# Patient Record
Sex: Male | Born: 1960 | Race: White | Hispanic: No | Smoking: Former smoker
Health system: Southern US, Community
[De-identification: ages and names within clinical notes are randomized; demographics above are authoritative.]

## PROBLEM LIST (undated history)

## (undated) DIAGNOSIS — F329 Major depressive disorder, single episode, unspecified: Secondary | ICD-10-CM

## (undated) DIAGNOSIS — G5600 Carpal tunnel syndrome, unspecified upper limb: Secondary | ICD-10-CM

## (undated) DIAGNOSIS — F419 Anxiety disorder, unspecified: Secondary | ICD-10-CM

## (undated) DIAGNOSIS — K219 Gastro-esophageal reflux disease without esophagitis: Secondary | ICD-10-CM

## (undated) DIAGNOSIS — F32A Depression, unspecified: Secondary | ICD-10-CM

## (undated) HISTORY — PX: MOUTH SURGERY: SHX715

---

## 2015-06-08 ENCOUNTER — Ambulatory Visit (INDEPENDENT_AMBULATORY_CARE_PROVIDER_SITE_OTHER): Payer: BLUE CROSS/BLUE SHIELD | Admitting: Psychiatry

## 2015-06-08 ENCOUNTER — Encounter (INDEPENDENT_AMBULATORY_CARE_PROVIDER_SITE_OTHER): Payer: Self-pay

## 2015-06-08 VITALS — BP 131/78 | HR 95 | Ht 68.55 in | Wt 238.6 lb

## 2015-06-08 DIAGNOSIS — F331 Major depressive disorder, recurrent, moderate: Secondary | ICD-10-CM

## 2015-06-08 MED ORDER — FLUOXETINE HCL 40 MG PO CAPS: 40.0000 mg | ORAL_CAPSULE | Freq: Every day | ORAL | Status: DC
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
## 2015-09-14 ENCOUNTER — Ambulatory Visit (INDEPENDENT_AMBULATORY_CARE_PROVIDER_SITE_OTHER): Payer: BLUE CROSS/BLUE SHIELD | Admitting: Psychiatry

## 2015-09-14 DIAGNOSIS — F3340 Major depressive disorder, recurrent, in remission, unspecified: Secondary | ICD-10-CM | POA: Diagnosis not present

## 2015-09-14 MED ORDER — FLUOXETINE HCL 40 MG PO CAPS: 40.0000 mg | ORAL_CAPSULE | Freq: Every day | ORAL | Status: DC

## 2015-09-14 NOTE — Progress Notes (Signed)
[facility] MD Progress Note  09/14/2015 5:02 PM Rick Barnes  MRN:  [id number] Subjective:  Feeling great Principal Problem: Major depression recurrent residual Diagnosis:  Major depression, recurrent residual Total Time spent with patient:   Past Psychiatric History: Patient is doing very well. He's back to work without any problems. He denies any irritability or anger. He denies any daily depression at this time. He sleeping much better and eating well. He doesn't drink very much alcohol at all. Have 1 or 2 drinks weekend. Once the patient went back to Prozac 40 mg within a week felt much calmer much more in control and was back to feeling normal. The patient has a good social life. He is not involved with any women. He likes his work great deal. Is a positive outlook. His energy is good. He denies the use of drugs. Is very evident this patient shows a great response to Prozac. He shows no evidence of psychosis. Medically he is very stable.  Past Medical History: No past medical history on file. No past surgical history on file. Family History: No family history on file. Family Psychiatric  History:  Social History:  History  Alcohol Use: Not on file     History  Drug Use Not on file    Social History   Social History  . Marital Status: Divorced    Spouse Name: N/A  . Number of Children: N/A  . Years of Education: N/A   Social History Main Topics  . Smoking status: Not on file  . Smokeless tobacco: Not on file  . Alcohol Use: Not on file  . Drug Use: Not on file  . Sexual Activity: Not on file   Other Topics Concern  . Not on file   Social History Narrative  . No narrative on file   Additional Social History:                         Sleep: Good  Appetite:  Good  Current Medications: Current Outpatient Prescriptions  Medication Sig Dispense Refill  . FLUoxetine (PROZAC) 40 MG capsule Take 1 capsule (40 mg total) by mouth daily. 30 capsule 4  . FLUoxetine  (PROZAC) 40 MG capsule Take 1 capsule (40 mg total) by mouth daily. 30 capsule 5   No current facility-administered medications for this visit.    Lab Results: No results found for this or any previous visit (from the past 48 hour(s)).  Blood Alcohol level:  No results found for: [facility]  Physical Findings: AIMS:  , ,  ,  ,    CIWA:    COWS:     Musculoskeletal: Strength & Muscle Tone: within normal limits Gait & Station: normal Patient leans: N/A  Psychiatric Specialty Exam: ROS  There were no vitals taken for this visit.There is no weight on file to calculate BMI.  General Appearance: Casual and Disheveled  Eye Contact::  Good  Speech:  Clear and Coherent  Volume:  Normal  Mood:  Euthymic  Affect:  Appropriate  Thought Process:  Coherent  Orientation:  Full (Time, Place, and Person)  Thought Content:  WDL  Suicidal Thoughts:  No  Homicidal Thoughts:  No  Memory:  NA  Judgement:  Good  Insight:  NA and Good  Psychomotor Activity:  Normal  Concentration:  Good  Recall:  Good  Fund of Knowledge:Good  Language: Good  Akathisia:  No  Handed:  Right  AIMS (if indicated):  Assets:  Desire for Improvement  ADL's:  Intact  Cognition: WNL  Sleep:      Treatment Plan Summary: At this time the patient will continue taking 40 mg of Prozac. Effective Prozac seems to reduce not only his depression but his irritability and anger. This is not unusual in a man of this patient age. For him a good mood is not depressed means is even calm without anxiety or irritability. The patient is sleeping and eating well. He has no vegetative symptoms at all. He is literally back to himself. He she'll return to see me in 4 months for med check. The patient is not suicidal and uses no significant illicit substances.  Lucas Mallow, MD 09/14/2015, 5:02 PM

## 2016-01-11 ENCOUNTER — Ambulatory Visit (HOSPITAL_COMMUNITY): Payer: Self-pay | Admitting: Psychiatry

## 2016-04-04 ENCOUNTER — Encounter (HOSPITAL_COMMUNITY): Payer: Self-pay | Admitting: Psychiatry

## 2016-04-09 ENCOUNTER — Other Ambulatory Visit: Payer: Self-pay | Admitting: Orthopedic Surgery

## 2016-04-09 ENCOUNTER — Other Ambulatory Visit (HOSPITAL_COMMUNITY): Payer: Self-pay

## 2016-04-09 ENCOUNTER — Other Ambulatory Visit (HOSPITAL_COMMUNITY): Payer: Self-pay | Admitting: Psychiatry

## 2016-04-09 ENCOUNTER — Encounter (HOSPITAL_BASED_OUTPATIENT_CLINIC_OR_DEPARTMENT_OTHER): Payer: Self-pay | Admitting: *Deleted

## 2016-04-09 MED ORDER — FLUOXETINE HCL 40 MG PO CAPS: 40.0000 mg | ORAL_CAPSULE | Freq: Every day | ORAL | 0 refills | Status: AC

## 2016-04-10 ENCOUNTER — Encounter (HOSPITAL_COMMUNITY)
Admission: AD | Disposition: A | Discharge status: Home / Self Care | Payer: Self-pay | Source: Ambulatory Visit | Attending: Nephrology

## 2016-04-10 ENCOUNTER — Inpatient Hospital Stay (HOSPITAL_BASED_OUTPATIENT_CLINIC_OR_DEPARTMENT_OTHER)
Admission: AD | Admit: 2016-04-10 | Discharge: 2016-04-13 | DRG: 987 | Disposition: A | Discharge status: Home / Self Care | Payer: BLUE CROSS/BLUE SHIELD | Source: Ambulatory Visit | Attending: Nephrology | Admitting: Nephrology

## 2016-04-10 ENCOUNTER — Observation Stay (HOSPITAL_COMMUNITY): Payer: BLUE CROSS/BLUE SHIELD

## 2016-04-10 ENCOUNTER — Ambulatory Visit (HOSPITAL_BASED_OUTPATIENT_CLINIC_OR_DEPARTMENT_OTHER): Payer: BLUE CROSS/BLUE SHIELD | Admitting: Anesthesiology

## 2016-04-10 ENCOUNTER — Encounter (HOSPITAL_BASED_OUTPATIENT_CLINIC_OR_DEPARTMENT_OTHER): Payer: Self-pay | Admitting: Certified Registered"

## 2016-04-10 ENCOUNTER — Ambulatory Visit (HOSPITAL_COMMUNITY): Payer: BLUE CROSS/BLUE SHIELD

## 2016-04-10 DIAGNOSIS — K219 Gastro-esophageal reflux disease without esophagitis: Secondary | ICD-10-CM | POA: Diagnosis present

## 2016-04-10 DIAGNOSIS — R0902 Hypoxemia: Secondary | ICD-10-CM

## 2016-04-10 DIAGNOSIS — F419 Anxiety disorder, unspecified: Secondary | ICD-10-CM | POA: Diagnosis present

## 2016-04-10 DIAGNOSIS — Z79899 Other long term (current) drug therapy: Secondary | ICD-10-CM

## 2016-04-10 DIAGNOSIS — G5603 Carpal tunnel syndrome, bilateral upper limbs: Secondary | ICD-10-CM | POA: Diagnosis present

## 2016-04-10 DIAGNOSIS — Z87891 Personal history of nicotine dependence: Secondary | ICD-10-CM

## 2016-04-10 DIAGNOSIS — Z880 Allergy status to penicillin: Secondary | ICD-10-CM

## 2016-04-10 DIAGNOSIS — G5602 Carpal tunnel syndrome, left upper limb: Secondary | ICD-10-CM | POA: Diagnosis not present

## 2016-04-10 DIAGNOSIS — A419 Sepsis, unspecified organism: Secondary | ICD-10-CM | POA: Diagnosis present

## 2016-04-10 DIAGNOSIS — R062 Wheezing: Secondary | ICD-10-CM

## 2016-04-10 DIAGNOSIS — F32A Depression, unspecified: Secondary | ICD-10-CM | POA: Diagnosis present

## 2016-04-10 DIAGNOSIS — K210 Gastro-esophageal reflux disease with esophagitis: Secondary | ICD-10-CM

## 2016-04-10 DIAGNOSIS — J9588 Other intraoperative complications of respiratory system, not elsewhere classified: Principal | ICD-10-CM | POA: Diagnosis present

## 2016-04-10 DIAGNOSIS — T17908A Unspecified foreign body in respiratory tract, part unspecified causing other injury, initial encounter: Secondary | ICD-10-CM | POA: Diagnosis present

## 2016-04-10 DIAGNOSIS — J9601 Acute respiratory failure with hypoxia: Secondary | ICD-10-CM | POA: Diagnosis present

## 2016-04-10 DIAGNOSIS — G5600 Carpal tunnel syndrome, unspecified upper limb: Secondary | ICD-10-CM | POA: Diagnosis present

## 2016-04-10 DIAGNOSIS — R0602 Shortness of breath: Secondary | ICD-10-CM | POA: Diagnosis not present

## 2016-04-10 DIAGNOSIS — Z884 Allergy status to anesthetic agent status: Secondary | ICD-10-CM | POA: Diagnosis not present

## 2016-04-10 DIAGNOSIS — J690 Pneumonitis due to inhalation of food and vomit: Secondary | ICD-10-CM

## 2016-04-10 DIAGNOSIS — D66 Hereditary factor VIII deficiency: Secondary | ICD-10-CM | POA: Diagnosis present

## 2016-04-10 DIAGNOSIS — F329 Major depressive disorder, single episode, unspecified: Secondary | ICD-10-CM | POA: Diagnosis present

## 2016-04-10 DIAGNOSIS — Y838 Other surgical procedures as the cause of abnormal reaction of the patient, or of later complication, without mention of misadventure at the time of the procedure: Secondary | ICD-10-CM | POA: Diagnosis present

## 2016-04-10 HISTORY — DX: Depression, unspecified: F32.A

## 2016-04-10 HISTORY — DX: Anxiety disorder, unspecified: F41.9

## 2016-04-10 HISTORY — DX: Major depressive disorder, single episode, unspecified: F32.9

## 2016-04-10 HISTORY — DX: Carpal tunnel syndrome, unspecified upper limb: G56.00

## 2016-04-10 HISTORY — PX: CARPAL TUNNEL RELEASE: SHX101

## 2016-04-10 HISTORY — DX: Gastro-esophageal reflux disease without esophagitis: K21.9

## 2016-04-10 LAB — BASIC METABOLIC PANEL
ANION GAP: 11 (ref 5–15)
BUN: 9 mg/dL (ref 6–20)
CALCIUM: 8.8 mg/dL — AB (ref 8.9–10.3)
CO2: 22 mmol/L (ref 22–32)
CREATININE: 0.92 mg/dL (ref 0.61–1.24)
Chloride: 103 mmol/L (ref 101–111)
Glucose, Bld: 90 mg/dL (ref 65–99)
Potassium: 3.9 mmol/L (ref 3.5–5.1)
SODIUM: 136 mmol/L (ref 135–145)

## 2016-04-10 LAB — CBC
HEMATOCRIT: 44.6 % (ref 39.0–52.0)
Hemoglobin: 15.8 g/dL (ref 13.0–17.0)
MCH: 33.3 pg (ref 26.0–34.0)
MCHC: 35.4 g/dL (ref 30.0–36.0)
MCV: 94.1 fL (ref 78.0–100.0)
PLATELETS: 183 10*3/uL (ref 150–400)
RBC: 4.74 MIL/uL (ref 4.22–5.81)
RDW: 13.3 % (ref 11.5–15.5)
WBC: 16.6 10*3/uL — AB (ref 4.0–10.5)

## 2016-04-10 LAB — LACTIC ACID, PLASMA
LACTIC ACID, VENOUS: 1.9 mmol/L (ref 0.5–1.9)
Lactic Acid, Venous: 3.6 mmol/L (ref 0.5–1.9)

## 2016-04-10 LAB — PROTIME-INR
INR: 1.05
PROTHROMBIN TIME: 13.7 s (ref 11.4–15.2)

## 2016-04-10 LAB — APTT: aPTT: 35 seconds (ref 24–36)

## 2016-04-10 LAB — PROCALCITONIN: Procalcitonin: 0.23 ng/mL

## 2016-04-10 LAB — TSH: TSH: 0.935 u[IU]/mL (ref 0.350–4.500)

## 2016-04-10 SURGERY — CARPAL TUNNEL RELEASE
Anesthesia: Monitor Anesthesia Care | Site: Wrist | Laterality: Left

## 2016-04-10 MED ORDER — VANCOMYCIN HCL IN DEXTROSE 1-5 GM/200ML-% IV SOLN
INTRAVENOUS | Status: AC
  Filled 2016-04-10: qty 200

## 2016-04-10 MED ORDER — MIDAZOLAM HCL 2 MG/2ML IJ SOLN
INTRAMUSCULAR | Status: AC
  Filled 2016-04-10: qty 2

## 2016-04-10 MED ORDER — GABAPENTIN 300 MG PO CAPS
300.0000 mg | ORAL_CAPSULE | Freq: Once | ORAL | Status: AC
  Administered 2016-04-10: 300 mg via ORAL

## 2016-04-10 MED ORDER — SODIUM CHLORIDE 0.9 % IV BOLUS (SEPSIS)
1000.0000 mL | Freq: Once | INTRAVENOUS | Status: AC
  Administered 2016-04-10: 1000 mL via INTRAVENOUS

## 2016-04-10 MED ORDER — FUROSEMIDE 10 MG/ML IJ SOLN
INTRAMUSCULAR | Status: AC
  Filled 2016-04-10: qty 4

## 2016-04-10 MED ORDER — MIDAZOLAM HCL 2 MG/2ML IJ SOLN
1.0000 mg | INTRAMUSCULAR | Status: DC | PRN
  Administered 2016-04-10: 2 mg via INTRAVENOUS

## 2016-04-10 MED ORDER — ALBUTEROL SULFATE HFA 108 (90 BASE) MCG/ACT IN AERS
INHALATION_SPRAY | RESPIRATORY_TRACT | Status: DC | PRN
  Administered 2016-04-10 (×2): 2 via RESPIRATORY_TRACT

## 2016-04-10 MED ORDER — ALBUTEROL SULFATE (2.5 MG/3ML) 0.083% IN NEBU
INHALATION_SOLUTION | RESPIRATORY_TRACT | Status: AC
  Filled 2016-04-10: qty 3

## 2016-04-10 MED ORDER — GABAPENTIN 300 MG PO CAPS
300.0000 mg | ORAL_CAPSULE | Freq: Three times a day (TID) | ORAL | Status: DC
  Administered 2016-04-10 – 2016-04-13 (×8): 300 mg via ORAL
  Filled 2016-04-10 (×8): qty 1

## 2016-04-10 MED ORDER — ONDANSETRON HCL 4 MG/2ML IJ SOLN
INTRAMUSCULAR | Status: DC | PRN
  Administered 2016-04-10: 4 mg via INTRAVENOUS

## 2016-04-10 MED ORDER — PROMETHAZINE HCL 25 MG/ML IJ SOLN: 6.2500 mg | INTRAMUSCULAR | Status: DC | PRN

## 2016-04-10 MED ORDER — VANCOMYCIN HCL IN DEXTROSE 1-5 GM/200ML-% IV SOLN
1000.0000 mg | Freq: Once | INTRAVENOUS | Status: AC
  Administered 2016-04-10: 1000 mg via INTRAVENOUS

## 2016-04-10 MED ORDER — SODIUM CHLORIDE 0.9 % IV BOLUS (SEPSIS): 1000.0000 mL | Freq: Once | INTRAVENOUS | Status: DC

## 2016-04-10 MED ORDER — FENTANYL CITRATE (PF) 100 MCG/2ML IJ SOLN
50.0000 ug | INTRAMUSCULAR | Status: DC | PRN
  Administered 2016-04-10: 12.5 ug via INTRAVENOUS
  Administered 2016-04-10: 50 ug via INTRAVENOUS

## 2016-04-10 MED ORDER — HYDROCODONE-ACETAMINOPHEN 5-325 MG PO TABS: 1.0000 | ORAL_TABLET | Freq: Four times a day (QID) | ORAL | 0 refills | Status: AC | PRN

## 2016-04-10 MED ORDER — GABAPENTIN 300 MG PO CAPS
ORAL_CAPSULE | ORAL | Status: AC
  Filled 2016-04-10: qty 1

## 2016-04-10 MED ORDER — LIDOCAINE 2% (20 MG/ML) 5 ML SYRINGE
INTRAMUSCULAR | Status: AC
  Filled 2016-04-10: qty 5

## 2016-04-10 MED ORDER — LACTATED RINGERS IV SOLN
INTRAVENOUS | Status: DC
  Administered 2016-04-10 (×2): via INTRAVENOUS

## 2016-04-10 MED ORDER — ONDANSETRON HCL 4 MG/2ML IJ SOLN
INTRAMUSCULAR | Status: AC
  Filled 2016-04-10: qty 2

## 2016-04-10 MED ORDER — VANCOMYCIN HCL IN DEXTROSE 1-5 GM/200ML-% IV SOLN
1000.0000 mg | Freq: Two times a day (BID) | INTRAVENOUS | Status: DC
  Administered 2016-04-11 – 2016-04-13 (×5): 1000 mg via INTRAVENOUS
  Filled 2016-04-10 (×6): qty 200

## 2016-04-10 MED ORDER — FAMOTIDINE 20 MG PO TABS
20.0000 mg | ORAL_TABLET | Freq: Every day | ORAL | Status: DC
  Administered 2016-04-10 – 2016-04-12 (×3): 20 mg via ORAL
  Filled 2016-04-10 (×3): qty 1

## 2016-04-10 MED ORDER — SODIUM CHLORIDE 0.9 % IV SOLN
1.0000 g | Freq: Once | INTRAVENOUS | Status: AC
  Administered 2016-04-10: 1 g via INTRAVENOUS
  Filled 2016-04-10: qty 1

## 2016-04-10 MED ORDER — FENTANYL CITRATE (PF) 100 MCG/2ML IJ SOLN
INTRAMUSCULAR | Status: AC
  Filled 2016-04-10: qty 2

## 2016-04-10 MED ORDER — HYDRALAZINE HCL 20 MG/ML IJ SOLN: 10.0000 mg | Freq: Four times a day (QID) | INTRAMUSCULAR | Status: DC | PRN

## 2016-04-10 MED ORDER — FLUOXETINE HCL 20 MG PO CAPS
40.0000 mg | ORAL_CAPSULE | Freq: Every day | ORAL | Status: DC
  Administered 2016-04-11 – 2016-04-13 (×3): 40 mg via ORAL
  Filled 2016-04-10 (×3): qty 2

## 2016-04-10 MED ORDER — LIDOCAINE HCL (PF) 0.5 % IJ SOLN
INTRAMUSCULAR | Status: DC | PRN
Start: 2016-04-10 — End: 2016-04-10
  Administered 2016-04-10: 30 mL via INTRAVENOUS

## 2016-04-10 MED ORDER — SODIUM CHLORIDE 0.9 % IV SOLN
INTRAVENOUS | Status: AC
  Administered 2016-04-10 – 2016-04-11 (×2): via INTRAVENOUS

## 2016-04-10 MED ORDER — SODIUM CHLORIDE 0.9 % IV BOLUS (SEPSIS): 500.0000 mL | Freq: Once | INTRAVENOUS | Status: DC

## 2016-04-10 MED ORDER — VANCOMYCIN HCL IN DEXTROSE 1-5 GM/200ML-% IV SOLN
1000.0000 mg | Freq: Once | INTRAVENOUS | Status: AC
  Administered 2016-04-10: 1000 mg via INTRAVENOUS
  Filled 2016-04-10: qty 200

## 2016-04-10 MED ORDER — PROPOFOL 10 MG/ML IV BOLUS
INTRAVENOUS | Status: DC | PRN
  Administered 2016-04-10 (×2): 10 mg via INTRAVENOUS

## 2016-04-10 MED ORDER — FENTANYL CITRATE (PF) 100 MCG/2ML IJ SOLN: 25.0000 ug | INTRAMUSCULAR | Status: DC | PRN

## 2016-04-10 MED ORDER — CHLORHEXIDINE GLUCONATE 4 % EX LIQD: 60.0000 mL | Freq: Once | CUTANEOUS | Status: DC

## 2016-04-10 MED ORDER — FUROSEMIDE 10 MG/ML IJ SOLN
10.0000 mg | Freq: Once | INTRAMUSCULAR | Status: AC
  Administered 2016-04-10: 10 mg via INTRAVENOUS

## 2016-04-10 MED ORDER — MORPHINE SULFATE (PF) 2 MG/ML IV SOLN: 2.0000 mg | INTRAVENOUS | Status: DC | PRN

## 2016-04-10 MED ORDER — ALBUTEROL SULFATE (2.5 MG/3ML) 0.083% IN NEBU: 2.5000 mg | INHALATION_SOLUTION | RESPIRATORY_TRACT | Status: DC | PRN

## 2016-04-10 MED ORDER — ALBUTEROL SULFATE (2.5 MG/3ML) 0.083% IN NEBU
2.5000 mg | INHALATION_SOLUTION | Freq: Once | RESPIRATORY_TRACT | Status: AC
  Administered 2016-04-10: 2.5 mg via RESPIRATORY_TRACT

## 2016-04-10 MED ORDER — SODIUM CHLORIDE 0.9 % IV SOLN
1.0000 g | Freq: Three times a day (TID) | INTRAVENOUS | Status: DC
  Administered 2016-04-11 – 2016-04-13 (×7): 1 g via INTRAVENOUS
  Filled 2016-04-10 (×9): qty 1

## 2016-04-10 MED ORDER — GLYCOPYRROLATE 0.2 MG/ML IJ SOLN: 0.2000 mg | Freq: Once | INTRAMUSCULAR | Status: DC | PRN

## 2016-04-10 MED ORDER — ONDANSETRON HCL 4 MG/2ML IJ SOLN: 4.0000 mg | Freq: Four times a day (QID) | INTRAMUSCULAR | Status: DC | PRN

## 2016-04-10 MED ORDER — BUPIVACAINE HCL (PF) 0.25 % IJ SOLN
INTRAMUSCULAR | Status: DC | PRN
  Administered 2016-04-10: 8 mL

## 2016-04-10 MED ORDER — ACETAMINOPHEN 325 MG PO TABS: 650.0000 mg | ORAL_TABLET | Freq: Four times a day (QID) | ORAL | Status: DC | PRN

## 2016-04-10 MED ORDER — HEPARIN SODIUM (PORCINE) 5000 UNIT/ML IJ SOLN
5000.0000 [IU] | Freq: Three times a day (TID) | INTRAMUSCULAR | Status: DC
  Filled 2016-04-10 (×2): qty 1

## 2016-04-10 MED ORDER — SCOPOLAMINE 1 MG/3DAYS TD PT72: 1.0000 | MEDICATED_PATCH | Freq: Once | TRANSDERMAL | Status: DC | PRN

## 2016-04-10 MED ORDER — HYDROCODONE-ACETAMINOPHEN 5-325 MG PO TABS
1.0000 | ORAL_TABLET | ORAL | Status: DC | PRN
  Administered 2016-04-11 – 2016-04-12 (×6): 1 via ORAL
  Filled 2016-04-10 (×7): qty 1

## 2016-04-10 SURGICAL SUPPLY — 36 items
BLADE SURG 15 STRL LF DISP TIS (BLADE) ×1 IMPLANT
BLADE SURG 15 STRL SS (BLADE) ×3
BNDG CMPR 9X4 STRL LF SNTH (GAUZE/BANDAGES/DRESSINGS)
BNDG COHESIVE 3X5 TAN STRL LF (GAUZE/BANDAGES/DRESSINGS) ×3 IMPLANT
BNDG ESMARK 4X9 LF (GAUZE/BANDAGES/DRESSINGS) IMPLANT
BNDG GAUZE ELAST 4 BULKY (GAUZE/BANDAGES/DRESSINGS) ×3 IMPLANT
CHLORAPREP W/TINT 26ML (MISCELLANEOUS) ×3 IMPLANT
CORDS BIPOLAR (ELECTRODE) ×3 IMPLANT
COVER BACK TABLE 60X90IN (DRAPES) ×3 IMPLANT
COVER MAYO STAND STRL (DRAPES) ×3 IMPLANT
CUFF TOURNIQUET SINGLE 18IN (TOURNIQUET CUFF) ×3 IMPLANT
DRAPE EXTREMITY T 121X128X90 (DRAPE) ×3 IMPLANT
DRAPE SURG 17X23 STRL (DRAPES) ×3 IMPLANT
DRSG PAD ABDOMINAL 8X10 ST (GAUZE/BANDAGES/DRESSINGS) ×3 IMPLANT
GAUZE SPONGE 4X4 12PLY STRL (GAUZE/BANDAGES/DRESSINGS) ×3 IMPLANT
GAUZE XEROFORM 1X8 LF (GAUZE/BANDAGES/DRESSINGS) ×3 IMPLANT
GLOVE BIOGEL PI IND STRL 7.0 (GLOVE) IMPLANT
GLOVE BIOGEL PI IND STRL 8.5 (GLOVE) ×1 IMPLANT
GLOVE BIOGEL PI INDICATOR 7.0 (GLOVE) ×4
GLOVE BIOGEL PI INDICATOR 8.5 (GLOVE) ×2
GLOVE ECLIPSE 6.5 STRL STRAW (GLOVE) ×2 IMPLANT
GLOVE SURG ORTHO 8.0 STRL STRW (GLOVE) ×3 IMPLANT
GOWN STRL REUS W/ TWL LRG LVL3 (GOWN DISPOSABLE) ×1 IMPLANT
GOWN STRL REUS W/TWL LRG LVL3 (GOWN DISPOSABLE) ×3
GOWN STRL REUS W/TWL XL LVL3 (GOWN DISPOSABLE) ×3 IMPLANT
NDL PRECISIONGLIDE 27X1.5 (NEEDLE) IMPLANT
NEEDLE PRECISIONGLIDE 27X1.5 (NEEDLE) ×3 IMPLANT
NS IRRIG 1000ML POUR BTL (IV SOLUTION) ×3 IMPLANT
PACK BASIN DAY SURGERY FS (CUSTOM PROCEDURE TRAY) ×3 IMPLANT
STOCKINETTE 4X48 STRL (DRAPES) ×3 IMPLANT
SUT ETHILON 4 0 PS 2 18 (SUTURE) ×3 IMPLANT
SUT VICRYL 4-0 PS2 18IN ABS (SUTURE) IMPLANT
SYR BULB 3OZ (MISCELLANEOUS) ×3 IMPLANT
SYR CONTROL 10ML LL (SYRINGE) ×2 IMPLANT
TOWEL OR 17X24 6PK STRL BLUE (TOWEL DISPOSABLE) ×3 IMPLANT
UNDERPAD 30X30 (UNDERPADS AND DIAPERS) ×1 IMPLANT

## 2016-04-10 NOTE — Care Management Note (Signed)
Case Management Note  Patient Details  Name: Rick Barnes MRN: [id number] Date of Birth: [birth year]  Subjective/Objective:  Patient presents with s/p left carpal tunnel release and became hypoxic, ? If aspirated during procedure, he is septic from aspiration pna per MD note.  NCM will cont to follow for dc needs.                 Action/Plan:   Expected Discharge Date:                  Expected Discharge Plan:  Home w Home Health Services  In-House Referral:     Discharge planning Services  CM Consult  Post Acute Care Choice:    Choice offered to:     DME Arranged:    DME Agency:     HH Arranged:    HH Agency:     Status of Service:  In process, will continue to follow  If discussed at Long Length of Stay Meetings, dates discussed:    Additional Comments:  [name], Jinger [place], RN 04/10/2016, 7:29 PM

## 2016-04-10 NOTE — Anesthesia Preprocedure Evaluation (Addendum)
Anesthesia Evaluation  Patient identified by MRN, date of birth, ID band Patient awake    Reviewed: Allergy & Precautions, NPO status , Patient's Chart, lab work & pertinent test results  Airway Mallampati: II  TM Distance: >3 FB Neck ROM: Full    Dental  (+) Dental Advisory Given, Lower Dentures, Upper Dentures   Pulmonary neg pulmonary ROS, former smoker,    Pulmonary exam normal breath sounds clear to auscultation       Cardiovascular Exercise Tolerance: Good negative cardio ROS Normal cardiovascular exam Rhythm:Regular Rate:Normal     Neuro/Psych PSYCHIATRIC DISORDERS Anxiety Depression negative neurological ROS     GI/Hepatic negative GI ROS, Neg liver ROS, GERD  Medicated,  Endo/Other  negative endocrine ROS  Renal/GU negative Renal ROS  negative genitourinary   Musculoskeletal negative musculoskeletal ROS (+)   Abdominal   Peds  Hematology negative hematology ROS (+)   Anesthesia Other Findings Day of surgery medications reviewed with the patient.  Reproductive/Obstetrics                            Anesthesia Physical Anesthesia Plan  ASA: II  Anesthesia Plan: MAC and Bier Block   Post-op Pain Management:    Induction: Intravenous  Airway Management Planned: Nasal Cannula  Additional Equipment:   Intra-op Plan:   Post-operative Plan:   Informed Consent: I have reviewed the patients History and Physical, chart, labs and discussed the procedure including the risks, benefits and alternatives for the proposed anesthesia with the patient or authorized representative who has indicated his/her understanding and acceptance.   Dental advisory given  Plan Discussed with:   Anesthesia Plan Comments: (Risks/benefits of regional block discussed with patient including risk of bleeding, infection, nerve damage, and possibility of failed block.  Also discussed backup plan of general  anesthesia and associated risks.  Patient wishes to proceed.)        Anesthesia Quick Evaluation

## 2016-04-10 NOTE — Op Note (Signed)
NAME:  Rocky LinkWILLIAMSON, Yousaf            ACCOUNT NO.:  [id number]  MEDICAL RECORD NO.:  [id number]  LOCATION:                                 FACILITY:  PHYSICIAN:  Cindee SaltGary Kassie Keng, M.D.            DATE OF BIRTH:  DATE OF PROCEDURE:  04/10/2016 DATE OF DISCHARGE:                              OPERATIVE REPORT   PREOPERATIVE DIAGNOSIS:  Carpal tunnel syndrome, left hand.  POSTOPERATIVE DIAGNOSIS:  Carpal tunnel syndrome, left hand.  OPERATION:  Decompression of left median nerve.  SURGEON:  Cindee SaltGary Sherene Plancarte, MD.  ANESTHESIA:  Forearm IV regional with local infiltration.  ANESTHESIOLOGIST:  Dr. Desmond Lopeurk.  [facility]  HISTORY:  The patient is a 55 year old male with a history of bilateral carpal tunnel syndrome.  He has elected to undergo surgical decompression to the left side.  Pre, peri, and postoperative course have been discussed along with risks and complications.  He is aware that there is no guarantee with the surgery; the possibility of infection, recurrence of injury to arteries, nerves, tendons, incomplete relief of symptoms and dystrophy.  In the preoperative area, the patient was seen, the extremity marked by both the patient and surgeon and antibiotic given.  PROCEDURE IN DETAIL:  The patient was brought to the operating room, where a forearm-based IV regional anesthetic was carried out without difficulty under the direction of Dr. Lyda PeroneKirk.  He was prepped using ChloraPrep in a supine position with the left arm free.  A 3-minute dry time was allowed.  Time-out taken confirming the patient and procedure. A longitudinal incision was made in the left palm, carried down through subcutaneous tissue.  Bleeders were electrocauterized with bipolar. Palmar fascia was split.  Superficial palmar arch identified.  The flexor tendon to the ring and little finger were identified.  Retractors were placed sweeping the median nerve radially and the ulnar nerve ulnarly.   The flexor retinaculum was incised with sharp dissection on the ulnar border.  A right angle and Sewall retractor were placed between skin and forearm fascia.  The nerve was dissected with blunt dissection from the overlying fascia.  A blunt nose scissors were then used to transect the forearm fascia proximally for approximately 3 cm proximal to the wrist crease under direct vision.  The motor branch was noted to enter into the muscle distally on exploration.  Persistent median artery was present.  Air of compression to the nerve was apparent.  No further lesions were identified.  The wound was irrigated with saline.  The skin was closed with interrupted 4-0 nylon sutures.  A local infiltration with 0.25% bupivacaine without epinephrine was given, approximately 8 mL was used.  A sterile compressive dressing with the fingers free was applied. On deflation of the tourniquet, all fingers immediately pinked.  He was taken to the recovery room for observation in satisfactory condition. He will be discharged home [facility] week on [place].          ______________________________ Cindee SaltGary Halley Shepheard, M.D.     GK/MEDQ  D:  04/10/2016  T:  04/10/2016  Job:  [id number]

## 2016-04-10 NOTE — Transfer of Care (Signed)
Immediate Anesthesia Transfer of Care Note  Patient: Rick Barnes  Procedure(s) Performed: Procedure(s): LEFT CARPAL TUNNEL RELEASE (Left)  Patient Location: PACU  Anesthesia Type:Bier block  Level of Consciousness: awake, alert , oriented and patient cooperative  Airway & Oxygen Therapy: Patient Spontanous Breathing and Patient connected to face mask oxygen  Post-op Assessment: Report given to RN and Post -op Vital signs reviewed and stable  Post vital signs: Reviewed and stable  Last Vitals:  Vitals:   04/10/16 1121  BP: (!) 164/98  Pulse: 96  Resp: 18  Temp: 36.7 C    Last Pain:  Vitals:   04/10/16 1121  TempSrc: Oral  PainSc: 2          Complications: No apparent anesthesia complications

## 2016-04-10 NOTE — Brief Op Note (Signed)
04/10/2016  1:12 PM  PATIENT:  Rick Barnes  55 y.o. male  PRE-OPERATIVE DIAGNOSIS:  Left Carpal Tunnel Syndrome  G56.07  POST-OPERATIVE DIAGNOSIS:  Left Carpal Tunnel Syndrome  G56.07  PROCEDURE:  Procedure(s): LEFT CARPAL TUNNEL RELEASE (Left)  SURGEON:  Surgeon(s) and Role:    * Cindee SaltGary Shatha Hooser, MD - Primary  PHYSICIAN ASSISTANT:   ASSISTANTS: none   ANESTHESIA:   local and regional  EBL:  Total I/O In: -  Out: 2 [Blood:2]  BLOOD ADMINISTERED:none  DRAINS: none   LOCAL MEDICATIONS USED:  BUPIVICAINE   SPECIMEN:  No Specimen  DISPOSITION OF SPECIMEN:  N/A  COUNTS:  YES  TOURNIQUET:   Total Tourniquet Time Documented: Forearm (Left) - 23 minutes Total: Forearm (Left) - 23 minutes   DICTATION: .Other Dictation: Dictation Number [id number]  PLAN OF CARE: Discharge to home after PACU  PATIENT DISPOSITION:  PACU - hemodynamically stable.

## 2016-04-10 NOTE — Anesthesia Postprocedure Evaluation (Signed)
Anesthesia Post Note  Patient: Rick Barnes  Procedure(s) Performed: Procedure(s) (LRB): LEFT CARPAL TUNNEL RELEASE (Left)  Patient location during evaluation: PACU Anesthesia Type: MAC and Bier Block Level of consciousness: awake and alert Pain management: pain level controlled Vital Signs Assessment: post-procedure vital signs reviewed and stable Respiratory status: spontaneous breathing and patient connected to face mask oxygen Cardiovascular status: stable and blood pressure returned to baseline Anesthetic complications: yes Anesthetic complication details: respiratory eventComments: Patient with witnessed aspiration of clear fluid during case with desaturation event requiring bag mask ventilation.  Patient received albuterol intraop, arrived to PACU with audible wheezing and intermittently desaturating into 80s despite 8L O2 facemask.  Patient received duoneb and lasix in PACU with improvement in respiratory function.  Unable to wean patient from facemask oxygen.  After discussion with surgeon, [facility] under care of hospitalist team.  Report by MDA to hospitalist prior to transfer to stepdown unit for continuous O2 monitoring.    Last Vitals:  Vitals:   04/10/16 1630 04/10/16 1645  BP: 139/86 (!) 149/92  Pulse: (!) 104 (!) 105  Resp: (!) 24 (!) 24  Temp:      Last Pain:  Vitals:   04/10/16 1645  TempSrc:   PainSc: 7                  Cecile HearingStephen Edward Lam Mccubbins

## 2016-04-10 NOTE — Progress Notes (Signed)
Pharmacy Antibiotic Note  Rick Barnes is a [age] y.o. male admitted on 04/10/2016 with sepsis.  Pharmacy has been consulted for meropenem and vancomycin dosing.  He is post-op from left carpal tunnel release with wheezing.  CXR: 9/12: neg;  He was hypoxic after extubated from surgery, concern for aspiration pneumonitits.  Temp 101.2, HR 122, 8L of oxygen.  Wt 103.5 kg, vanc 1 gm given at 1220, WBC elevated at 16.6, creat 0.92, PCT 0.23, lactate 1.9,   Plan: meropenem 1 gm IV q8h  Vancomycin 1 gm IV x1 at 2030 then vanc 1 gm IV  q12h per obesity nomogram F/u renal fxn, wbc, temp, culture data Vancomycin trough as needed  Height: 5\' 9"  (175.3 cm) Weight: 228 lb 3.2 oz (103.5 kg) IBW/kg (Calculated) : 70.7  Temp (24hrs), Avg:98.1 F (36.7 C), Min:98.1 F (36.7 C), Max:98.2 F (36.8 C)  No results for input(s): WBC, CREATININE, LATICACIDVEN, VANCOTROUGH, VANCOPEAK, VANCORANDOM, GENTTROUGH, GENTPEAK, GENTRANDOM, TOBRATROUGH, TOBRAPEAK, TOBRARND, AMIKACINPEAK, AMIKACINTROU, AMIKACIN in the last 168 hours.  CrCl cannot be calculated (No order found.).    Allergies  Allergen Reactions  . Penicillins Hives    Antimicrobials this admission: 9/12 vanc>> 9/12 meropenem>>  Dose adjustments this admission:   Microbiology results:   Thank you for allowing pharmacy to be a part of this patient's care.  Herby AbrahamMichelle T. Yun Gutierrez, Pharm.D. [phone] 04/10/2016 6:27 PM

## 2016-04-10 NOTE — Op Note (Signed)
Dictation Number [id number]

## 2016-04-10 NOTE — H&P (Signed)
Rick BelfastJames R Barnes is an 55 y.o. male.   Chief Complaint: numbness left hand ZOX:WRUEAHPI:Rick Barnes is a 55 year old right-hand-dominant male comes in with a complaint of pain in both hands left greater than right with numbness and tingling in the thumb index middle fingers primarily. He states this electrical type of lightening type burning pain with a VAS score of 5/10. This is been going on for years. He states pulling and twisting increases pain for him. He has no history of injury to the hand or to the neck. He states he is awakened 7 out of 7 nights. He is placed on gabapentin by Dr. Margo Barnes. He is referred by Mr. Rick RidgesWalsh is a physical therapist. He has no history of diabetes thyroid problems arthritis or gout. His family history is negative for each of these also. He does have a history of hepatitis C. He states his been cured. There is any heavy use increases his pain for him. He is a Administrator, Civil Serviceprofessional table soccer player. He states contrast passing braces which he has been wearing have helped. He has not had nerve conductions done.He has had his nerve conductions done by Dr. Riccardo Barnes. These were done today. These are reviewed with him. He has no history of diabetes thyroid problem or gout. He does have a history of hep C. He states this is been cured.      Past Medical History:  Diagnosis Date  . Anxiety  . Depressed  . Hemophilia (HCC)  . High serum hepcidin   Past Surgical History:  Procedure Laterality Date  . COLON SURGERY       Past Medical History:  Diagnosis Date  . Anxiety   . Carpal tunnel syndrome   . Depression   . GERD (gastroesophageal reflux disease)     Past Surgical History:  Procedure Laterality Date  . MOUTH SURGERY      History reviewed. No pertinent family history. Social History:  reports that he has quit smoking. He has never used smokeless tobacco. He reports that he drinks alcohol. He reports that he does not use drugs.  Allergies:  Allergies  Allergen Reactions   . Penicillins Hives    Medications Prior to Admission  Medication Sig Dispense Refill  . FLUoxetine (PROZAC) 40 MG capsule Take 1 capsule (40 mg total) by mouth daily. 30 capsule 0  . gabapentin (NEURONTIN) 300 MG capsule Take 300 mg by mouth 3 (three) times daily.    . ranitidine (ZANTAC) 150 MG tablet Take 150 mg by mouth 2 (two) times daily.      No results found for this or any previous visit (from the past 48 hour(s)).  No results found.   Pertinent items are noted in HPI.  Blood pressure (!) 164/98, pulse 96, temperature 98.1 F (36.7 C), temperature source Oral, resp. rate 18, height 5\' 9"  (1.753 m), weight 103.5 kg (228 lb 3.2 oz), SpO2 99 %.  General appearance: alert, cooperative and appears stated age Head: Normocephalic, without obvious abnormality Neck: no JVD Resp: clear to auscultation bilaterally Cardio: regular rate and rhythm, S1, S2 normal, no murmur, click, rub or gallop GI: soft, non-tender; bowel sounds normal; no masses,  no organomegaly Extremities: numbness left hand Pulses: 2+ and symmetric Skin: Skin color, texture, turgor normal. No rashes or lesions Neurologic: Grossly normal Incision/Wound: na  Assessment/Plan Assessment:  1. Bilateral carpal tunnel syndrome    Plan: We have reviewed his nerve conductions with him. This reveals bilateral carpal tunnel syndrome. He has some motor delay  of 5.42 on the left and 6.41 on the right. Waveforms are discussed with him also. He is advised of the possibility of surgical intervention or injections. He would like to have this operated on. He states it is been going on too long. Pre-peri-and postoperative course were discussed along with risks and complications. He is aware that there is no guarantee to the surgery the possibility of infection recurrence injury to arteries nerves tendons incomplete release symptoms and dystrophy are all discussed with him. He would like to go ahead and schedule his left side.  He states this is bothering him more than his right at the present time. He would also like to schedule his right side approximately 1 month later. He does have slightly increased blood pressure today. He states that he was stopped by the police on the way here. He is advised however that he may check with a family physician to have this investigated further. Scheduled for left carpal tunnel release and outpatient under regional anesthesia. Questions are encouraged and answered to his satisfaction. Follow up: No Follow-up on file.      Rick Barnes R 04/10/2016, 11:27 AM

## 2016-04-10 NOTE — Discharge Instructions (Signed)

## 2016-04-10 NOTE — H&P (Signed)
TRH H&P   Patient Demographics:    Cranford Blessinger, is a 55 y.o. male  MRN: [id number]   DOB - 1961/06/10  Admit Date - 04/10/2016  Outpatient Primary MD for the patient is Pcp Not In System    Patient coming from: OR  No chief complaint on file.     HPI:    Americo Vallery  is a 55 y.o. male, With past medical history of anxiety, who came to the hospital for elective left arm carpal tunnel release surgery which was done in the OR today by Dr. Merlyn Lot, after the surgery and extubation patient was noted to be hypoxic, there was suspicion that he aspirated during the procedure, he was requiring oxygen of up to 8 L/m, we would then requested to admit him to the hospital for aspiration pneumonitis.  In the stepdown unit when I came to see the patient he was warm, temperature was 101.2, heart rate was 122, he was still profoundly hypoxic requiring 8 L of oxygen, he is currently septic from aspiration pneumonia with acute hypoxic respiratory failure. He does appear nontoxic at this time. Besides cough and shortness of breath and mild left arm postop pain he's symptom-free. He denies any previous medical issues, no COPD, asthma, nonsmoker.    Review of systems:    In addition to the HPI above,   No Fever-chills, No Headache, No changes with Vision or hearing, No problems swallowing food or Liquids, No Chest pain, ++ Cough & Shortness of Breath, No Abdominal pain, No Nausea or Vommitting, Bowel movements are regular, No Blood in stool or Urine, No dysuria, No new skin rashes or bruises, No new joints pains-aches,  No new weakness, tingling, numbness in any extremity, No recent weight gain or loss, No polyuria,  polydypsia or polyphagia, No significant Mental Stressors.  A full 10 point Review of Systems was done, except as stated above, all other Review of Systems were negative.   With Past History of the following :    Past Medical History:  Diagnosis Date  . Anxiety   . Carpal tunnel syndrome   . Depression   . GERD (gastroesophageal reflux disease)       Past Surgical History:  Procedure Laterality Date  . MOUTH SURGERY        Social History:  Social History  Substance Use Topics  . Smoking status: Former Games developer  . Smokeless tobacco: Never Used  . Alcohol use Yes     Comment: quit 15 yr ago      Family History :   No CAD   Home Medications:   Prior to Admission medications   Medication Sig Start Date End Date Taking? Authorizing Provider  FLUoxetine (PROZAC) 40 MG capsule Take 1 capsule (40 mg total) by mouth daily. 04/09/16 04/09/17 Yes Archer Asa, MD  gabapentin (NEURONTIN) 300 MG capsule Take 300 mg by mouth 3 (three) times daily.   Yes Historical Provider, MD  ranitidine (ZANTAC) 150 MG tablet Take 150 mg by mouth 2 (two) times daily.   Yes Historical Provider, MD  HYDROcodone-acetaminophen (NORCO) 5-325 MG tablet Take 1 tablet by mouth every 6 (six) hours as needed for moderate pain. 04/10/16   Cindee Salt, MD     Allergies:     Allergies  Allergen Reactions  . Penicillins Hives     Physical Exam:   Vitals  Blood pressure (!) 149/92, pulse (!) 105, temperature 98.2 F (36.8 C), resp. rate (!) 24, height 5\' 9"  (1.753 m), weight 103.5 kg (228 lb 3.2 oz), SpO2 95 %.   1. General middle aged white male lying in bed in NAD,     2. Normal affect and insight, Not Suicidal or Homicidal, Awake Alert, Oriented X 3.  3. No F.N deficits, ALL C.Nerves Intact, Strength 5/5 all 4 extremities, Sensation intact all 4 extremities, Plantars down going.  4. Ears and Eyes appear Normal, Conjunctivae clear, PERRLA. Moist Oral Mucosa.  5. Supple Neck, No JVD, No  cervical lymphadenopathy appriciated, No Carotid Bruits.  6. Symmetrical Chest wall movement, Good air movement bilaterally, +ve rales  7. RRR, No Gallops, Rubs or Murmurs, No Parasternal Heave.  8. Positive Bowel Sounds, Abdomen Soft, No tenderness, No organomegaly appriciated,No rebound -guarding or rigidity.  9.  No Cyanosis, Normal Skin Turgor, No Skin Rash or Bruise.  10. Good muscle tone,  joints appear normal , no effusions, Normal ROM. L arm in bandage.  11. No Palpable Lymph Nodes in Neck or Axillae      Data Review:    CBC No results for input(s): WBC, HGB, HCT, PLT, MCV, MCH, MCHC, RDW, LYMPHSABS, MONOABS, EOSABS, BASOSABS, BANDABS in the last 168 hours.  Invalid input(s): NEUTRABS, BANDSABD ------------------------------------------------------------------------------------------------------------------  Chemistries  No results for input(s): NA, K, CL, CO2, GLUCOSE, BUN, CREATININE, CALCIUM, MG, AST, ALT, ALKPHOS, BILITOT in the last 168 hours.  Invalid input(s): GFRCGP ------------------------------------------------------------------------------------------------------------------ CrCl cannot be calculated (No order found.). ------------------------------------------------------------------------------------------------------------------ No results for input(s): TSH, T4TOTAL, T3FREE, THYROIDAB in the last 72 hours.  Invalid input(s): FREET3  Coagulation profile No results for input(s): INR, PROTIME in the last 168 hours. ------------------------------------------------------------------------------------------------------------------- No results for input(s): DDIMER in the last 72 hours. -------------------------------------------------------------------------------------------------------------------  Cardiac Enzymes No results for input(s): CKMB, TROPONINI, MYOGLOBIN in the last 168 hours.  Invalid input(s):  CK ------------------------------------------------------------------------------------------------------------------ No results found for: BNP   ---------------------------------------------------------------------------------------------------------------  Urinalysis No results found for: COLORURINE, APPEARANCEUR, LABSPEC, PHURINE, GLUCOSEU, HGBUR, BILIRUBINUR, KETONESUR, PROTEINUR, UROBILINOGEN, NITRITE, LEUKOCYTESUR  ----------------------------------------------------------------------------------------------------------------   Imaging Results:    X-ray Chest Pa Or Ap  Result Date: 04/10/2016 CLINICAL DATA:  Postop from left carpal tunnel release.  Wheezing. EXAM: CHEST 1 VIEW COMPARISON:  None. FINDINGS: Study somewhat limited by the patient's body habitus and relatively low lung volumes as well as the semi-erect AP technique. On this for the above  limitation, the heart, mediastinum and hila are unremarkable. The lungs are clear.  No pleural effusion or pneumothorax. Skeletal structures are grossly unremarkable. IMPRESSION: No active disease. Electronically Signed   By: Amie Portlandavid  Ormond M.D.   On: 04/10/2016 14:58     Assessment & Plan:     1. Sepsis due to Asp PNA - Be admitted to stepdown, he is still hypoxic, now febrile and tachycardic, sepsis protocol will be initiated, blood cultures, CBC, CMP, lactic acid and pro-calcitonin. IV fluid bolus and maintenance, Empiric IV meropenem and vancomycin as he is PCN allergic. Repeat 2 view chest x-ray in the morning.  2. Acute hypoxic respiratory failure due to #1 above. Treatment as above, oxygen, nebulizer treatments added flutter valve and I S, encouraged to sit in chair.  3. Anxiety. Continue home medications.  4. Elective left carpal tunnel release surgery. Pain control, elevate arm, follow with Dr. Merlyn LotKuzma post discharge.   DVT Prophylaxis Heparin   AM Labs Ordered, also please review Full Orders  Family Communication:  Admission, patients condition and plan of care including tests being ordered have been discussed with the patient  who indicate understanding and agree with the plan and Code Status.  Code Status Full   Likely DC to  Home 2-3 days  Condition GUARDED    Consults called: None    Admission status: Inpt    Time spent in minutes : 35   Susa RaringSINGH,Dannisha Eckmann K M.D on 04/10/2016 at 6:15 PM  Between 7am to 7pm - Pager - [phone]. After 7pm go to [email or web address] - password [email or web address]  [facility] - Office  [phone]

## 2016-04-10 NOTE — Progress Notes (Signed)
CRITICAL VALUE ALERT  Critical value received:  Lactic Acid 3.6  Date of notification:  04/10/2016  Time of notification:  2234  Critical value read back:Yes.    Nurse who received alert:  Ricky StabsKelly Leslyn Monda  MD notified (1st page):  K Schorr   Time of first page:  2235  MD notified (2nd page):  Time of second page:  Responding MD:     Time MD responded:

## 2016-04-11 ENCOUNTER — Inpatient Hospital Stay (HOSPITAL_COMMUNITY): Payer: BLUE CROSS/BLUE SHIELD

## 2016-04-11 ENCOUNTER — Encounter (HOSPITAL_BASED_OUTPATIENT_CLINIC_OR_DEPARTMENT_OTHER): Payer: Self-pay | Admitting: Orthopedic Surgery

## 2016-04-11 ENCOUNTER — Other Ambulatory Visit: Payer: Self-pay | Admitting: Orthopedic Surgery

## 2016-04-11 DIAGNOSIS — R0902 Hypoxemia: Secondary | ICD-10-CM

## 2016-04-11 DIAGNOSIS — J690 Pneumonitis due to inhalation of food and vomit: Secondary | ICD-10-CM

## 2016-04-11 LAB — COMPREHENSIVE METABOLIC PANEL
ALBUMIN: 3.2 g/dL — AB (ref 3.5–5.0)
ALT: 25 U/L (ref 17–63)
AST: 23 U/L (ref 15–41)
Alkaline Phosphatase: 36 U/L — ABNORMAL LOW (ref 38–126)
Anion gap: 6 (ref 5–15)
BUN: 11 mg/dL (ref 6–20)
CHLORIDE: 104 mmol/L (ref 101–111)
CO2: 26 mmol/L (ref 22–32)
Calcium: 8.1 mg/dL — ABNORMAL LOW (ref 8.9–10.3)
Creatinine, Ser: 1.04 mg/dL (ref 0.61–1.24)
GFR calc Af Amer: >60 mL/min (ref >60)
GFR calc non Af Amer: >60 mL/min (ref >60)
GLUCOSE: 123 mg/dL — AB (ref 65–99)
POTASSIUM: 3.5 mmol/L (ref 3.5–5.1)
SODIUM: 136 mmol/L (ref 135–145)
Total Bilirubin: 0.9 mg/dL (ref 0.3–1.2)
Total Protein: 5.5 g/dL — ABNORMAL LOW (ref 6.5–8.1)

## 2016-04-11 LAB — CBC
HCT: 38.2 % — ABNORMAL LOW (ref 39.0–52.0)
HEMOGLOBIN: 13 g/dL (ref 13.0–17.0)
MCH: 32.3 pg (ref 26.0–34.0)
MCHC: 34 g/dL (ref 30.0–36.0)
MCV: 95 fL (ref 78.0–100.0)
PLATELETS: 152 10*3/uL (ref 150–400)
RBC: 4.02 MIL/uL — AB (ref 4.22–5.81)
RDW: 13.3 % (ref 11.5–15.5)
WBC: 15.9 10*3/uL — AB (ref 4.0–10.5)

## 2016-04-11 LAB — URINALYSIS, ROUTINE W REFLEX MICROSCOPIC
BILIRUBIN URINE: NEGATIVE
GLUCOSE, UA: NEGATIVE mg/dL
HGB URINE DIPSTICK: NEGATIVE
Ketones, ur: NEGATIVE mg/dL
Leukocytes, UA: NEGATIVE
Nitrite: NEGATIVE
PROTEIN: NEGATIVE mg/dL
Specific Gravity, Urine: 1.013 (ref 1.005–1.030)
pH: 5.5 (ref 5.0–8.0)

## 2016-04-11 LAB — LACTIC ACID, PLASMA: Lactic Acid, Venous: 1.8 mmol/L (ref 0.5–1.9)

## 2016-04-11 LAB — MRSA PCR SCREENING: MRSA BY PCR: NEGATIVE

## 2016-04-11 LAB — GLUCOSE, CAPILLARY: GLUCOSE-CAPILLARY: 86 mg/dL (ref 65–99)

## 2016-04-11 NOTE — Progress Notes (Signed)
PROGRESS NOTE    Rick Barnes  ZOX:[id number] DOB: 11-09-1960 DOA: 04/10/2016 PCP: Pcp Not In System    Brief Narrative: 55 y.o. male,with past medical history of anxiety, who came to the hospital for elective left arm carpal tunnel release surgery which was done in the OR today by Dr. Merlyn LotKuzma, after the surgery and extubation patient was noted to be hypoxic, there was suspicion that he aspirated during the procedure, he was requiring oxygen of up to 8 L/m, we would then requested to admit him to the hospital for aspiration pneumonitis.  Assessment & Plan:   # sepsis due to aspiration pneumonia: possibly related with mechanical ventilation, general anesthesia: clinically improving. Follow up culture results. Continue IV meropenem and vancomycin. Chest x-ray shows no acute finding.    #Acute hypoxic respiratory failure related with aspiration pneumonia: Require to 8 L of oxygen on admission. Weaning oxygen gradually. Continue with bronchodilators and breathing treatment.  # GERD (gastroesophageal reflux disease): Continue with Pepcid.  # Depression: Continue Prozac.  #  Carpal tunnel syndrome is status post elective surgery: Advised outpatient follow-up with his hand surgeon.   Continue current medical and supportive care.  DVT prophylaxis: Heparin subcutaneous. Code Status: Full code.  Family Communication: Patient's sister at bedside. Disposition Plan: Likely discharge home in 1-2 days.    Antimicrobials: On meropenem and vancomycin IV. Able to switch to oral antibiotics by tomorrow if patient continues to improve clinically.  Subjective: Patient was seen and examined at bedside. Patient reported feeling much better. He denied chest pain, shortness of breath, nausea or vomiting. He was waiting 2-3 L of oxygen today morning. He wants to go home today however, he agreed to stay to continue further treatment.  Objective: Vitals:   04/11/16 0700 04/11/16 0737 04/11/16 1157  04/11/16 1500  BP:  121/79 (!) 142/77 127/72  Pulse:  88 87 89  Resp:  (!) 21 15 17   Temp: 97.5 F (36.4 C)  97.5 F (36.4 C) 97.7 F (36.5 C)  TempSrc: Oral  Oral Oral  SpO2:  97% 95% 92%  Weight:      Height:        Intake/Output Summary (Last 24 hours) at 04/11/16 1903 Last data filed at 04/11/16 1600  Gross per 24 hour  Intake             4390 ml  Output              750 ml  Net             3640 ml   Filed Weights   04/09/16 1408 04/10/16 1121 04/10/16 1738  Weight: 102.1 kg (225 lb) 103.5 kg (228 lb 3.2 oz) 103.5 kg (228 lb 2.8 oz)    Examination:  General exam: Appears calm and comfortable  Respiratory system:  Respiratory effort normal.No crackle or wheezing. Cardiovascular system: S1 & S2 heard, RRR.No pedal edema. Gastrointestinal system: Abdomen is nondistended, soft and nontender. Normal bowel sounds heard. Central nervous system: Alert and oriented. No focal neurological deficits. Extremities: Symmetric 5 x 5 power. Left wrist has dressing on. Able to move fingers. Skin: No rashes, lesions or ulcers    Data Reviewed: I have personally reviewed following labs and imaging studies  CBC:  Recent Labs Lab 04/10/16 1819 04/11/16 0042  WBC 16.6* 15.9*  HGB 15.8 13.0  HCT 44.6 38.2*  MCV 94.1 95.0  PLT 183 152   Basic Metabolic Panel:  Recent Labs Lab 04/10/16 1819 04/11/16 0042  NA 136 136  K 3.9 3.5  CL 103 104  CO2 22 26  GLUCOSE 90 123*  BUN 9 11  CREATININE 0.92 1.04  CALCIUM 8.8* 8.1*   GFR: Estimated Creatinine Clearance: 95.1 mL/min (by C-G formula based on SCr of 1.04 mg/dL). Liver Function Tests:  Recent Labs Lab 04/11/16 0042  AST 23  ALT 25  ALKPHOS 36*  BILITOT 0.9  PROT 5.5*  ALBUMIN 3.2*   No results for input(s): LIPASE, AMYLASE in the last 168 hours. No results for input(s): AMMONIA in the last 168 hours. Coagulation Profile:  Recent Labs Lab 04/10/16 1819  INR 1.05   Cardiac Enzymes: No results for  input(s): CKTOTAL, CKMB, CKMBINDEX, TROPONINI in the last 168 hours. BNP (last 3 results) No results for input(s): PROBNP in the last 8760 hours. HbA1C: No results for input(s): HGBA1C in the last 72 hours. CBG:  Recent Labs Lab 04/10/16 1731  GLUCAP 86   Lipid Profile: No results for input(s): CHOL, HDL, LDLCALC, TRIG, CHOLHDL, LDLDIRECT in the last 72 hours. Thyroid Function Tests:  Recent Labs  04/10/16 1819  TSH 0.935   Anemia Panel: No results for input(s): VITAMINB12, FOLATE, FERRITIN, TIBC, IRON, RETICCTPCT in the last 72 hours. Sepsis Labs:  Recent Labs Lab 04/10/16 1819 04/10/16 2118 04/11/16 0042  PROCALCITON 0.23  --   --   LATICACIDVEN 1.9 3.6* 1.8    Recent Results (from the past 240 hour(s))  MRSA PCR Screening     Status: None   Collection Time: 04/10/16  7:01 PM  Result Value Ref Range Status   MRSA by PCR NEGATIVE NEGATIVE Final    Comment:        The GeneXpert MRSA Assay (FDA approved for NASAL specimens only), is one component of a comprehensive MRSA colonization surveillance program. It is not intended to diagnose MRSA infection nor to guide or monitor treatment for MRSA infections.          Radiology Studies: X-ray Chest Pa Or Ap  Result Date: 04/10/2016 CLINICAL DATA:  Postop from left carpal tunnel release.  Wheezing. EXAM: CHEST 1 VIEW COMPARISON:  None. FINDINGS: Study somewhat limited by the patient's body habitus and relatively low lung volumes as well as the semi-erect AP technique. On this for the above limitation, the heart, mediastinum and hila are unremarkable. The lungs are clear.  No pleural effusion or pneumothorax. Skeletal structures are grossly unremarkable. IMPRESSION: No active disease. Electronically Signed   By: Amie Portland M.D.   On: 04/10/2016 14:58   Dg Chest 2 View  Result Date: 04/11/2016 CLINICAL DATA:  Sepsis, shortness of breath clinically improved EXAM: CHEST  2 VIEW COMPARISON:  Portable chest x-ray  of April 10, 2016 FINDINGS: The lungs are adequately inflated and clear. The heart and pulmonary vascularity are normal. The mediastinum is normal in width. There is no pleural effusion or pneumothorax. The bony thorax exhibits no acute abnormality. IMPRESSION: There is no active cardiopulmonary disease. Electronically Signed   By: David  [place] M.D.   On: 04/11/2016 07:31   Dg Chest Port 1 View  Result Date: 04/10/2016 CLINICAL DATA:  Cough, fever EXAM: PORTABLE CHEST 1 VIEW COMPARISON:  04/10/2016 FINDINGS: The heart size and mediastinal contours are within normal limits. Both lungs are clear. The visualized skeletal structures are unremarkable. IMPRESSION: No active disease. Electronically Signed   By: Elige Ko   On: 04/10/2016 19:05        Scheduled Meds: . famotidine  20 mg Oral  QHS  . FLUoxetine  40 mg Oral Daily  . gabapentin  300 mg Oral TID  . heparin subcutaneous  5,000 Units Subcutaneous Q8H  . meropenem (MERREM) IV  1 g Intravenous Q8H  . sodium chloride  1,000 mL Intravenous Once   And  . sodium chloride  1,000 mL Intravenous Once   And  . sodium chloride  500 mL Intravenous Once  . vancomycin  1,000 mg Intravenous Q12H   Continuous Infusions: . sodium chloride 75 mL/hr at 04/11/16 1609     LOS: 1 day    Time spent: 28 minutes    Vern Prestia Jaynie Collins, MD [facility] Pager [phone]  If 7PM-7AM, please contact night-coverage [email or web address] Password [email or web address] 04/11/2016, 7:03 PM

## 2016-04-11 NOTE — Care Management Note (Signed)
Case Management Note  Patient Details  Name: Rick Barnes MRN: [id number] Date of Birth: Sep 15, 1960  Subjective/Objective:  Presents s/p left carpal tunnel release procedure and became hypoxic, there was a question of aspiration pna per MD note   Patient lives alone, pta indep, he has no pcp, NCM gave patient [facility] information.  He states he [facility] [employer].  NCM will cont to follow for dc needs.                Action/Plan:   Expected Discharge Date:                  Expected Discharge Plan:  Home/Self Care  In-House Referral:     Discharge planning Services  CM Consult  Post Acute Care Choice:    Choice offered to:     DME Arranged:    DME Agency:     HH Arranged:    HH Agency:     Status of Service:  Completed, signed off  If discussed at [employer] Length of Stay Meetings, dates discussed:    Additional Comments:  [name], [name], RN 04/11/2016, 3:56 PM

## 2016-04-12 DIAGNOSIS — R0602 Shortness of breath: Secondary | ICD-10-CM

## 2016-04-12 LAB — URINE CULTURE: CULTURE: NO GROWTH

## 2016-04-12 NOTE — Progress Notes (Signed)
PROGRESS NOTE    Rick Barnes  WGN:[id number] DOB: 1961/07/28 DOA: 04/10/2016 PCP: Pcp Not In System    Brief Narrative: 55 y.o. male,with past medical history of anxiety, who came to the hospital for elective left arm carpal tunnel release surgery which was done in the OR today by Dr. Merlyn LotKuzma, after the surgery and extubation patient was noted to be hypoxic, there was suspicion that he aspirated during the procedure, he was requiring oxygen of up to 8 L/m, we would then requested to admit him to the hospital for aspiration pneumonitis.  Assessment & Plan:   # sepsis due to aspiration pneumonia: possibly related with mechanical ventilation, general anesthesia: Gradually improving. He was having SOB and sweating today. Continue to follow up culture results. Continue IV meropenem and vancomycin.  -repeat CXR tomorrow.  If continues to improve by tomorrow, I am planning to switch to oral antibiotics.     #Acute hypoxic respiratory failure related with aspiration pneumonia: Required to 8 L of oxygen on admission. Weaned to room air today. Continue with bronchodilators and breathing treatment.  # GERD (gastroesophageal reflux disease): Continue with Pepcid.  # Depression: Continue Prozac.  #  Carpal tunnel syndrome is status post elective surgery: Advised outpatient follow-up with his hand surgeon.   Continue current medical and supportive care.  DVT prophylaxis: Heparin subcutaneous. Code Status: Full code.  Family Communication: none Disposition Plan: Likely discharge home tomorrow.    Antimicrobials: On meropenem and vancomycin IV. Able to switch to oral antibiotics on 9/15 if patient continues to improve clinically.  Subjective: Patient was seen and examined at bedside.Pt was diaphoretic and mildy tachypnic. Reports mild SOB.  Denied chest pain, headache, nausea or vomiting.  Wants to take shower.  Objective: Vitals:   04/12/16 0700 04/12/16 0830 04/12/16 1100 04/12/16  1500  BP: 128/76  (!) 147/80 133/90  Pulse: 92 96 86 86  Resp: 18 (!) 23 (!) 22 (!) 31  Temp: 98.5 F (36.9 C)  97.7 F (36.5 C) 97.8 F (36.6 C)  TempSrc: Oral  Oral Oral  SpO2: 98% 93% 98% 95%  Weight:      Height:        Intake/Output Summary (Last 24 hours) at 04/12/16 1723 Last data filed at 04/12/16 1629  Gross per 24 hour  Intake              600 ml  Output             2700 ml  Net            -2100 ml   Filed Weights   04/09/16 1408 04/10/16 1121 04/10/16 1738  Weight: 102.1 kg (225 lb) 103.5 kg (228 lb 3.2 oz) 103.5 kg (228 lb 2.8 oz)    Examination:  General exam: NAD except mild tachypnic and diaphoretic. Overall looks better  Respiratory system:  Tachypnia, mild crackle on lower left lung base, no wheeze Cardiovascular system: RRR, s1s2 nl Gastrointestinal system: Bs+, soft, non-tender. Central nervous system: Aalert, awake, following commands Extremities: . Left wrist has dressing on. Able to move fingers. Skin: No rashes, lesions or ulcers    Data Reviewed: I have personally reviewed following labs and imaging studies  CBC:  Recent Labs Lab 04/10/16 1819 04/11/16 0042  WBC 16.6* 15.9*  HGB 15.8 13.0  HCT 44.6 38.2*  MCV 94.1 95.0  PLT 183 152   Basic Metabolic Panel:  Recent Labs Lab 04/10/16 1819 04/11/16 0042  NA 136 136  K  3.9 3.5  CL 103 104  CO2 22 26  GLUCOSE 90 123*  BUN 9 11  CREATININE 0.92 1.04  CALCIUM 8.8* 8.1*   GFR: Estimated Creatinine Clearance: 95.1 mL/min (by C-G formula based on SCr of 1.04 mg/dL). Liver Function Tests:  Recent Labs Lab 04/11/16 0042  AST 23  ALT 25  ALKPHOS 36*  BILITOT 0.9  PROT 5.5*  ALBUMIN 3.2*   No results for input(s): LIPASE, AMYLASE in the last 168 hours. No results for input(s): AMMONIA in the last 168 hours. Coagulation Profile:  Recent Labs Lab 04/10/16 1819  INR 1.05   Cardiac Enzymes: No results for input(s): CKTOTAL, CKMB, CKMBINDEX, TROPONINI in the last 168  hours. BNP (last 3 results) No results for input(s): PROBNP in the last 8760 hours. HbA1C: No results for input(s): HGBA1C in the last 72 hours. CBG:  Recent Labs Lab 04/10/16 1731  GLUCAP 86   Lipid Profile: No results for input(s): CHOL, HDL, LDLCALC, TRIG, CHOLHDL, LDLDIRECT in the last 72 hours. Thyroid Function Tests:  Recent Labs  04/10/16 1819  TSH 0.935   Anemia Panel: No results for input(s): VITAMINB12, FOLATE, FERRITIN, TIBC, IRON, RETICCTPCT in the last 72 hours. Sepsis Labs:  Recent Labs Lab 04/10/16 1819 04/10/16 2118 04/11/16 0042  PROCALCITON 0.23  --   --   LATICACIDVEN 1.9 3.6* 1.8    Recent Results (from the past 240 hour(s))  MRSA PCR Screening     Status: None   Collection Time: 04/10/16  7:01 PM  Result Value Ref Range Status   MRSA by PCR NEGATIVE NEGATIVE Final    Comment:        The GeneXpert MRSA Assay (FDA approved for NASAL specimens only), is one component of a comprehensive MRSA colonization surveillance program. It is not intended to diagnose MRSA infection nor to guide or monitor treatment for MRSA infections.   Culture, blood (routine x 2)     Status: None (Preliminary result)   Collection Time: 04/11/16  9:18 AM  Result Value Ref Range Status   Specimen Description BLOOD LEFT ANTECUBITAL  Final   Special Requests IN PEDIATRIC BOTTLE 3CC  Final   Culture NO GROWTH 1 DAY  Final   Report Status PENDING  Incomplete  Culture, blood (routine x 2)     Status: None (Preliminary result)   Collection Time: 04/11/16  9:18 AM  Result Value Ref Range Status   Specimen Description BLOOD LEFT ARM  Final   Special Requests IN PEDIATRIC BOTTLE 3CC  Final   Culture NO GROWTH 1 DAY  Final   Report Status PENDING  Incomplete  Culture, Urine     Status: None   Collection Time: 04/11/16 11:23 AM  Result Value Ref Range Status   Specimen Description URINE, CLEAN CATCH  Final   Special Requests NONE  Final   Culture NO GROWTH  Final    Report Status 04/12/2016 FINAL  Final         Radiology Studies: Dg Chest 2 View  Result Date: 04/11/2016 CLINICAL DATA:  Sepsis, shortness of breath clinically improved EXAM: CHEST  2 VIEW COMPARISON:  Portable chest x-ray of April 10, 2016 FINDINGS: The lungs are adequately inflated and clear. The heart and pulmonary vascularity are normal. The mediastinum is normal in width. There is no pleural effusion or pneumothorax. The bony thorax exhibits no acute abnormality. IMPRESSION: There is no active cardiopulmonary disease. Electronically Signed   By: David  [place] M.D.   On: 04/11/2016  07:31   Dg Chest Port 1 View  Result Date: 04/10/2016 CLINICAL DATA:  Cough, fever EXAM: PORTABLE CHEST 1 VIEW COMPARISON:  04/10/2016 FINDINGS: The heart size and mediastinal contours are within normal limits. Both lungs are clear. The visualized skeletal structures are unremarkable. IMPRESSION: No active disease. Electronically Signed   By: Elige Ko   On: 04/10/2016 19:05        Scheduled Meds: . famotidine  20 mg Oral QHS  . FLUoxetine  40 mg Oral Daily  . gabapentin  300 mg Oral TID  . heparin subcutaneous  5,000 Units Subcutaneous Q8H  . meropenem (MERREM) IV  1 g Intravenous Q8H  . sodium chloride  1,000 mL Intravenous Once   And  . sodium chloride  1,000 mL Intravenous Once   And  . sodium chloride  500 mL Intravenous Once  . vancomycin  1,000 mg Intravenous Q12H   Continuous Infusions:     LOS: 2 days    Time spent: 25 minutes    Chinelo Benn Jaynie Collins, MD [facility] Pager [phone]  If 7PM-7AM, please contact night-coverage [email or web address] Password [facility] 04/12/2016, 5:23 PM

## 2016-04-13 ENCOUNTER — Inpatient Hospital Stay (HOSPITAL_COMMUNITY): Payer: BLUE CROSS/BLUE SHIELD

## 2016-04-13 LAB — CBC
HEMATOCRIT: 38.1 % — AB (ref 39.0–52.0)
HEMOGLOBIN: 13.2 g/dL (ref 13.0–17.0)
MCH: 32.5 pg (ref 26.0–34.0)
MCHC: 34.6 g/dL (ref 30.0–36.0)
MCV: 93.8 fL (ref 78.0–100.0)
Platelets: 153 10*3/uL (ref 150–400)
RBC: 4.06 MIL/uL — AB (ref 4.22–5.81)
RDW: 13 % (ref 11.5–15.5)
WBC: 6.4 10*3/uL (ref 4.0–10.5)

## 2016-04-13 MED ORDER — CLINDAMYCIN HCL 150 MG PO CAPS: 450.0000 mg | ORAL_CAPSULE | Freq: Three times a day (TID) | ORAL | 0 refills | Status: AC

## 2016-04-13 NOTE — Discharge Summary (Signed)
Physician Discharge Summary  Rick Barnes:[id number] DOB: 18-Feb-1961 DOA: 04/10/2016  PCP: Pcp Not In System  Admit date: 04/10/2016 Discharge date: 04/13/2016  Admitted From:Home Disposition: Home  Recommendations for Outpatient Follow-up:  1. Follow up with PCP in 1-2 weeks 2. Please follow up on the following pending results: blood culture result with your PCP.  Home Health:no Equipment/Devices:none  Discharge Condition:stable CODE STATUS:full Diet recommendation: heart healthy  Brief/Interim Summary: 55 y.o.male,with past medical history of anxiety, who came to the hospital for elective left arm carpal tunnel release surgery by Dr. Merlyn Lot, after the surgery patient was extubated. However, patient was noted to be hypoxic, there was suspicion that he aspirated during the procedure, he was requiring oxygen of up to 8 L/m.  Patient was admitted for hypoxia and aspiration pneumonia. Patient was treated with IV antibiotics in the hospital with significant clinical improvement. Cultures are negative to date. The patient's oxygen saturation is acceptable in 2 m today. He denied fever, chills, nausea, vomiting, chest pain, shortness of breath. He wants to go home today. Patient is able to ambulate in the room without difficulties. He will be discharged home with 5 days of oral clindamycin for the treatment of aspiration pneumonia. Repeat x-ray unremarkable. Patient is allergic to penicillin. I advised patient to follow up with his PCP and orthopedic as an outpatient. Patient was educated that her prescription of clindamycin including GI upset and allergic reaction. I encouraged patient to take yogurt or probiotics while on antibiotics. Patient verbalized understanding. Patient is medically stable on discharge.  Discharge Diagnoses:  Principal Problem:   Aspiration into airway Active Problems:   GERD (gastroesophageal reflux disease)   Depression   Carpal tunnel syndrome   Anxiety  Sepsis (HCC)   Aspiration pneumonia (HCC)   Hypoxia   SOB (shortness of breath)    Discharge Instructions  Discharge Instructions    Call MD for:  temperature >100.4    Complete by:  As directed    Diet - low sodium heart healthy    Complete by:  As directed    Discharge instructions    Complete by:  As directed    Please follow up with your PCP in one week.   Increase activity slowly    Complete by:  As directed        Medication List    TAKE these medications   clindamycin 150 MG capsule Commonly known as:  CLEOCIN Take 3 capsules (450 mg total) by mouth 3 (three) times daily.   FLUoxetine 40 MG capsule Commonly known as:  PROZAC Take 1 capsule (40 mg total) by mouth daily.   gabapentin 300 MG capsule Commonly known as:  NEURONTIN Take 300 mg by mouth 3 (three) times daily.   HYDROcodone-acetaminophen 5-325 MG tablet Commonly known as:  NORCO Take 1 tablet by mouth every 6 (six) hours as needed for moderate pain.   ranitidine 150 MG tablet Commonly known as:  ZANTAC Take 150 mg by mouth 2 (two) times daily.      Follow-up Information    Nicki Reaper, MD. Schedule an appointment as soon as possible for a visit today.   Specialty:  Orthopedic Surgery Contact information: [address] [place] [address] [phone]          Allergies  Allergen Reactions  . Penicillins Hives    Consultations:  none   Procedures/Studies: X-ray Chest Pa Or Ap  Result Date: 04/10/2016 CLINICAL DATA:  Postop from left carpal tunnel release.  Wheezing. EXAM: CHEST 1 VIEW COMPARISON:  None. FINDINGS: Study somewhat limited by the patient's body habitus and relatively low lung volumes as well as the semi-erect AP technique. On this for the above limitation, the heart, mediastinum and hila are unremarkable. The lungs are clear.  No pleural effusion or pneumothorax. Skeletal structures are grossly unremarkable. IMPRESSION: No active disease. Electronically Signed    By: Amie Portlandavid  Ormond M.D.   On: 04/10/2016 14:58   Dg Chest 2 View  Result Date: 04/13/2016 CLINICAL DATA:  Pneumonia, shortness of Breath EXAM: CHEST  2 VIEW COMPARISON:  04/11/2016 FINDINGS: The heart size and mediastinal contours are within normal limits. Both lungs are clear. The visualized skeletal structures are unremarkable. IMPRESSION: No active cardiopulmonary disease. Electronically Signed   By: Natasha MeadLiviu  Pop M.D.   On: 04/13/2016 09:01   Dg Chest 2 View  Result Date: 04/11/2016 CLINICAL DATA:  Sepsis, shortness of breath clinically improved EXAM: CHEST  2 VIEW COMPARISON:  Portable chest x-ray of April 10, 2016 FINDINGS: The lungs are adequately inflated and clear. The heart and pulmonary vascularity are normal. The mediastinum is normal in width. There is no pleural effusion or pneumothorax. The bony thorax exhibits no acute abnormality. IMPRESSION: There is no active cardiopulmonary disease. Electronically Signed   By: David  [place] M.D.   On: 04/11/2016 07:31   Dg Chest Port 1 View  Result Date: 04/10/2016 CLINICAL DATA:  Cough, fever EXAM: PORTABLE CHEST 1 VIEW COMPARISON:  04/10/2016 FINDINGS: The heart size and mediastinal contours are within normal limits. Both lungs are clear. The visualized skeletal structures are unremarkable. IMPRESSION: No active disease. Electronically Signed   By: Elige KoHetal  Patel   On: 04/10/2016 19:05      Subjective:   Discharge Exam: Vitals:   04/13/16 0453 04/13/16 0835  BP: 130/73 (!) 140/92  Pulse: 78   Resp: 17 (!) 21  Temp: 97.7 F (36.5 C) 97.5 F (36.4 C)   Vitals:   04/12/16 1500 04/13/16 0043 04/13/16 0453 04/13/16 0835  BP: 133/90 (!) 143/95 130/73 (!) 140/92  Pulse: 86 73 78   Resp: (!) 31 16 17  (!) 21  Temp: 97.8 F (36.6 C) 97.6 F (36.4 C) 97.7 F (36.5 C) 97.5 F (36.4 C)  TempSrc: Oral Oral Oral Oral  SpO2: 95% 99% 98%   Weight:      Height:        General: Pt is alert, awake, not in acute distress Cardiovascular:  RRR, S1/S2 +, no rubs, no gallops Respiratory: CTA bilaterally, no wheezing, no rhonchi Abdominal: Soft, NT, ND, bowel sounds + Extremities: no edema, no cyanosis Left wrist has dressing on, able to move fingers.    The results of significant diagnostics from this hospitalization (including imaging, microbiology, ancillary and laboratory) are listed below for reference.     Microbiology: Recent Results (from the past 240 hour(s))  MRSA PCR Screening     Status: None   Collection Time: 04/10/16  7:01 PM  Result Value Ref Range Status   MRSA by PCR NEGATIVE NEGATIVE Final    Comment:        The GeneXpert MRSA Assay (FDA approved for NASAL specimens only), is one component of a comprehensive MRSA colonization surveillance program. It is not intended to diagnose MRSA infection nor to guide or monitor treatment for MRSA infections.   Culture, blood (routine x 2)     Status: None (Preliminary result)   Collection Time: 04/11/16  9:18 AM  Result Value Ref  Range Status   Specimen Description BLOOD LEFT ANTECUBITAL  Final   Special Requests IN PEDIATRIC BOTTLE 3CC  Final   Culture NO GROWTH 1 DAY  Final   Report Status PENDING  Incomplete  Culture, blood (routine x 2)     Status: None (Preliminary result)   Collection Time: 04/11/16  9:18 AM  Result Value Ref Range Status   Specimen Description BLOOD LEFT ARM  Final   Special Requests IN PEDIATRIC BOTTLE 3CC  Final   Culture NO GROWTH 1 DAY  Final   Report Status PENDING  Incomplete  Culture, Urine     Status: None   Collection Time: 04/11/16 11:23 AM  Result Value Ref Range Status   Specimen Description URINE, CLEAN CATCH  Final   Special Requests NONE  Final   Culture NO GROWTH  Final   Report Status 04/12/2016 FINAL  Final     Labs: BNP (last 3 results) No results for input(s): BNP in the last 8760 hours. Basic Metabolic Panel:  Recent Labs Lab 04/10/16 1819 04/11/16 0042  NA 136 136  K 3.9 3.5  CL 103 104  CO2  22 26  GLUCOSE 90 123*  BUN 9 11  CREATININE 0.92 1.04  CALCIUM 8.8* 8.1*   Liver Function Tests:  Recent Labs Lab 04/11/16 0042  AST 23  ALT 25  ALKPHOS 36*  BILITOT 0.9  PROT 5.5*  ALBUMIN 3.2*   No results for input(s): LIPASE, AMYLASE in the last 168 hours. No results for input(s): AMMONIA in the last 168 hours. CBC:  Recent Labs Lab 04/10/16 1819 04/11/16 0042 04/13/16 0315  WBC 16.6* 15.9* 6.4  HGB 15.8 13.0 13.2  HCT 44.6 38.2* 38.1*  MCV 94.1 95.0 93.8  PLT 183 152 153   Cardiac Enzymes: No results for input(s): CKTOTAL, CKMB, CKMBINDEX, TROPONINI in the last 168 hours. BNP: Invalid input(s): POCBNP CBG:  Recent Labs Lab 04/10/16 1731  GLUCAP 86   D-Dimer No results for input(s): DDIMER in the last 72 hours. Hgb A1c No results for input(s): HGBA1C in the last 72 hours. Lipid Profile No results for input(s): CHOL, HDL, LDLCALC, TRIG, CHOLHDL, LDLDIRECT in the last 72 hours. Thyroid function studies  Recent Labs  04/10/16 1819  TSH 0.935   Anemia work up No results for input(s): VITAMINB12, FOLATE, FERRITIN, TIBC, IRON, RETICCTPCT in the last 72 hours. Urinalysis    Component Value Date/Time   COLORURINE YELLOW 04/11/2016 1123   APPEARANCEUR CLOUDY (A) 04/11/2016 1123   LABSPEC 1.013 04/11/2016 1123   PHURINE 5.5 04/11/2016 1123   GLUCOSEU NEGATIVE 04/11/2016 1123   HGBUR NEGATIVE 04/11/2016 1123   BILIRUBINUR NEGATIVE 04/11/2016 1123   KETONESUR NEGATIVE 04/11/2016 1123   PROTEINUR NEGATIVE 04/11/2016 1123   NITRITE NEGATIVE 04/11/2016 1123   LEUKOCYTESUR NEGATIVE 04/11/2016 1123   Sepsis Labs Invalid input(s): PROCALCITONIN,  WBC,  LACTICIDVEN Microbiology Recent Results (from the past 240 hour(s))  MRSA PCR Screening     Status: None   Collection Time: 04/10/16  7:01 PM  Result Value Ref Range Status   MRSA by PCR NEGATIVE NEGATIVE Final    Comment:        The GeneXpert MRSA Assay (FDA approved for NASAL specimens only), is  one component of a comprehensive MRSA colonization surveillance program. It is not intended to diagnose MRSA infection nor to guide or monitor treatment for MRSA infections.   Culture, blood (routine x 2)     Status: None (Preliminary result)  Collection Time: 04/11/16  9:18 AM  Result Value Ref Range Status   Specimen Description BLOOD LEFT ANTECUBITAL  Final   Special Requests IN PEDIATRIC BOTTLE 3CC  Final   Culture NO GROWTH 1 DAY  Final   Report Status PENDING  Incomplete  Culture, blood (routine x 2)     Status: None (Preliminary result)   Collection Time: 04/11/16  9:18 AM  Result Value Ref Range Status   Specimen Description BLOOD LEFT ARM  Final   Special Requests IN PEDIATRIC BOTTLE 3CC  Final   Culture NO GROWTH 1 DAY  Final   Report Status PENDING  Incomplete  Culture, Urine     Status: None   Collection Time: 04/11/16 11:23 AM  Result Value Ref Range Status   Specimen Description URINE, CLEAN CATCH  Final   Special Requests NONE  Final   Culture NO GROWTH  Final   Report Status 04/12/2016 FINAL  Final     Time coordinating discharge:around 28  minutes  SIGNED:   Maxie Barb, MD  [facility] 04/13/2016, 11:39 AM Pager   If 7PM-7AM, please contact night-coverage [email or web address] Password [email or web address]

## 2016-04-13 NOTE — Progress Notes (Signed)
DC instructions given to patient and sister. All belongings retrieved from safe and sent home with patient. Gave pt wallet with $110 in cash. Called Dr. Daryll BrodG Kuzma office for norco Rx, office stated pt needs to go by when discharged to pick up Rx. Educated on dressing and wound infections, as well as, follow up appointments. Gave sling to patient for LUE. PIV DC, hemostasis achieved.VSS. Pt ambulatory to private vehicle driven by sister.

## 2016-04-13 NOTE — Progress Notes (Signed)
Pharmacy Antibiotic Note  Rick Barnes is a 55 y.o. male admitted on 04/10/2016 with sepsis.  Pharmacy has been consulted for meropenem and vancomycin dosing.    CXR: 9/12> 9/15 unchanged no cardiopulmonary disease, hypoxic after extubated from surgery, concern for aspiration pneumonitits. Afeb, wbc now normal, 8L>off of oxygen. LA 1.8, PCT 0.23  -cultures drawn after abx given have been negative  Plan:  Continue meropenem 1 gm IV q8h  Vancomycin 1 gm IV  q12h per obesity nomogram Vancomycin trough tomorrow if continued  Height: 5\' 9"  (175.3 cm) Weight: 228 lb 2.8 oz (103.5 kg) IBW/kg (Calculated) : 70.7  Temp (24hrs), Avg:97.7 F (36.5 C), Min:97.5 F (36.4 C), Max:97.8 F (36.6 C)   Recent Labs Lab 04/10/16 1819 04/10/16 2118 04/11/16 0042 04/13/16 0315  WBC 16.6*  --  15.9* 6.4  CREATININE 0.92  --  1.04  --   LATICACIDVEN 1.9 3.6* 1.8  --     Estimated Creatinine Clearance: 95.1 mL/min (by C-G formula based on SCr of 1.04 mg/dL).    Allergies  Allergen Reactions  . Penicillins Hives    Antimicrobials this admission: 9/12 vanc>> 9/12 meropenem>>  Dose adjustments this admission: n/a  Microbiology results: 9/13 BCx: ngtd 9/13 UCx:  ng 9/13 MRSA PCR: neg  Thank you for allowing pharmacy to be a part of this patient's care.  Sheppard CoilFrank Aiko Belko PharmD., BCPS Clinical Pharmacist Pager [phone] 04/13/2016 10:18 AM

## 2016-04-16 LAB — CULTURE, BLOOD (ROUTINE X 2)
CULTURE: NO GROWTH
Culture: NO GROWTH

## 2016-05-10 ENCOUNTER — Encounter (HOSPITAL_BASED_OUTPATIENT_CLINIC_OR_DEPARTMENT_OTHER): Payer: Self-pay

## 2016-05-10 ENCOUNTER — Ambulatory Visit (HOSPITAL_BASED_OUTPATIENT_CLINIC_OR_DEPARTMENT_OTHER): Admit: 2016-05-10 | Payer: BLUE CROSS/BLUE SHIELD | Admitting: Orthopedic Surgery

## 2016-05-10 SURGERY — CARPAL TUNNEL RELEASE
Anesthesia: Regional | Laterality: Right

## 2018-03-04 IMAGING — DX DG CHEST 2V
2 series · 2 of 2 positions shown · non-contrast
Comparison: Portable chest x-ray April 10, 2016

CLINICAL DATA: Sepsis, shortness of breath clinically improved

EXAM:
CHEST  2 VIEW

[chest pa]
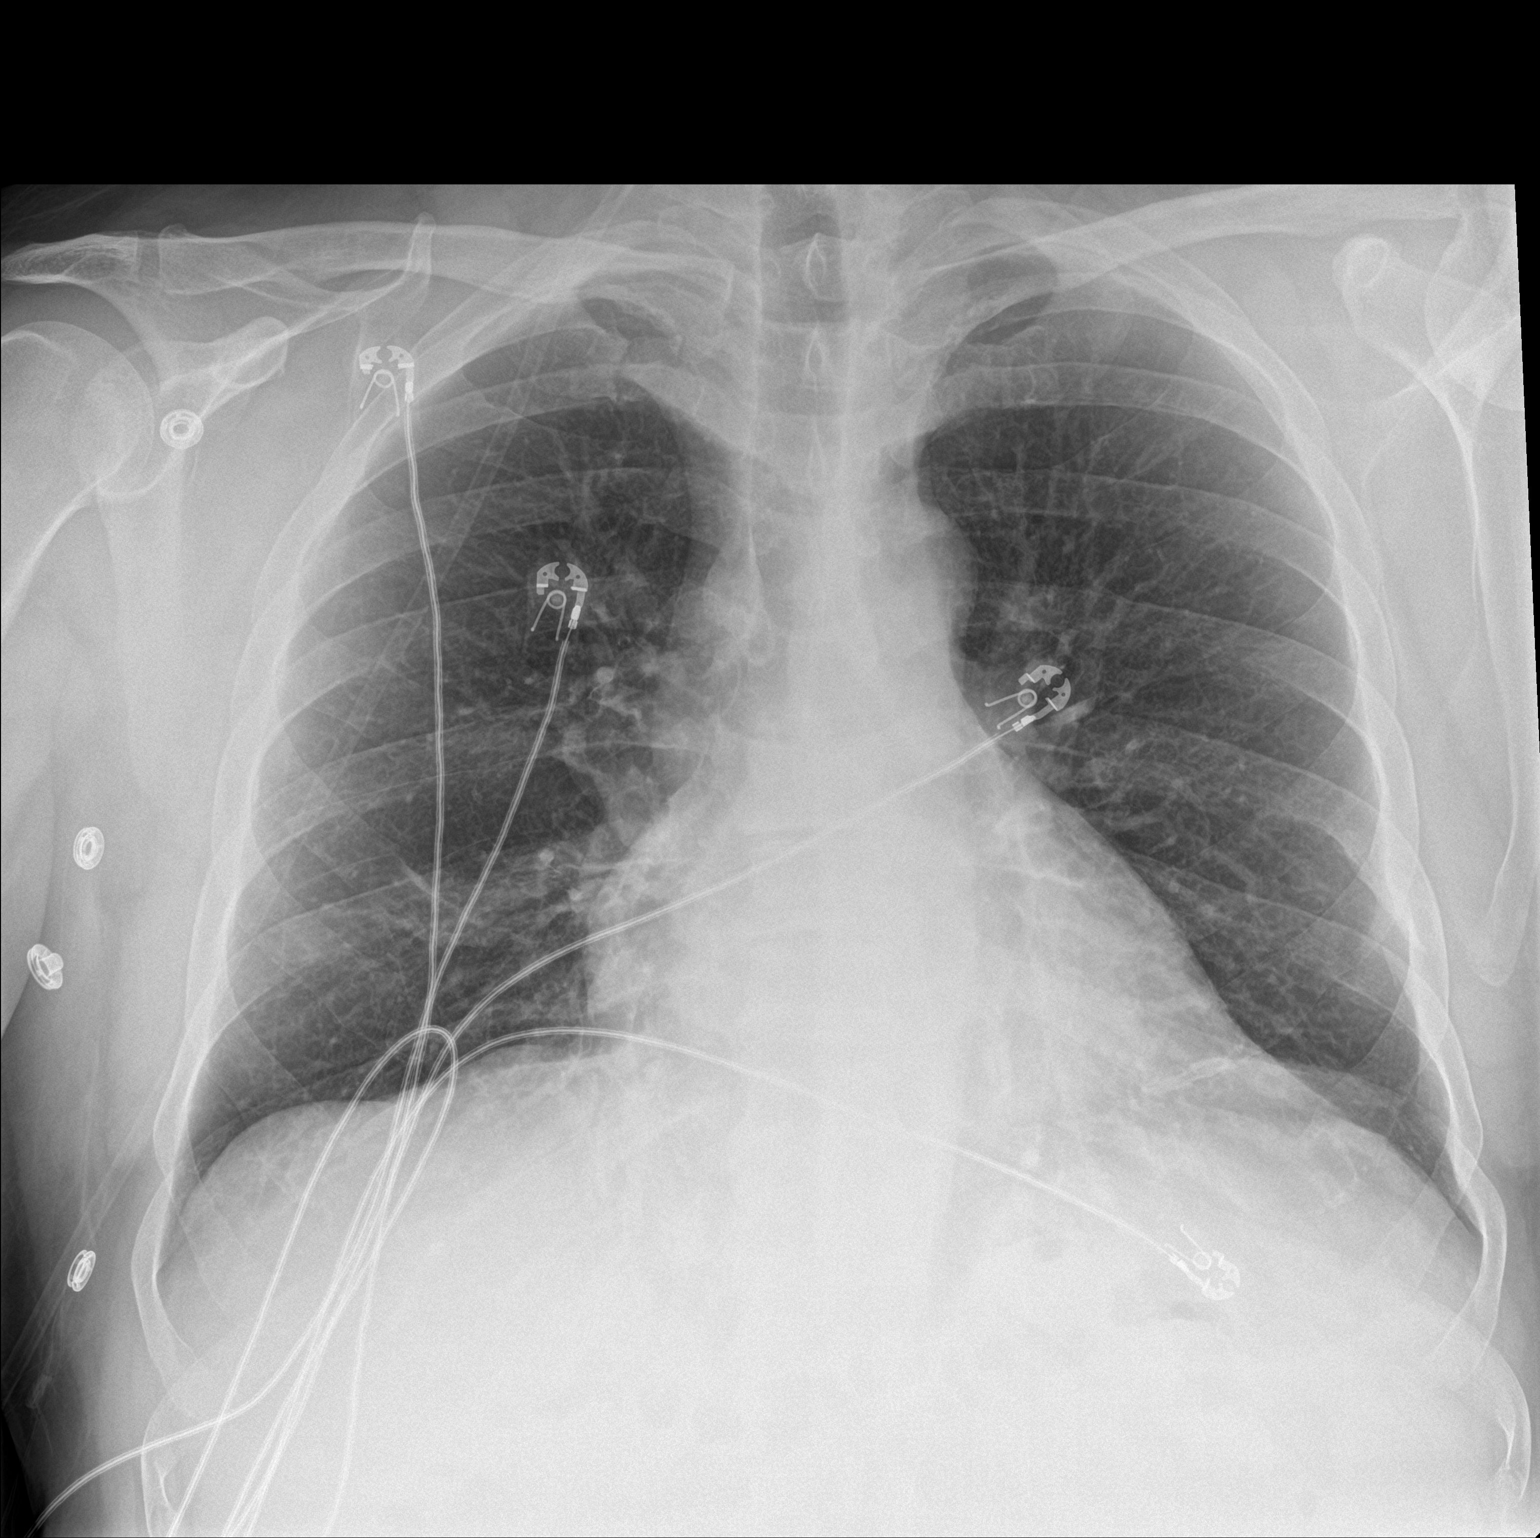

[chest lat]
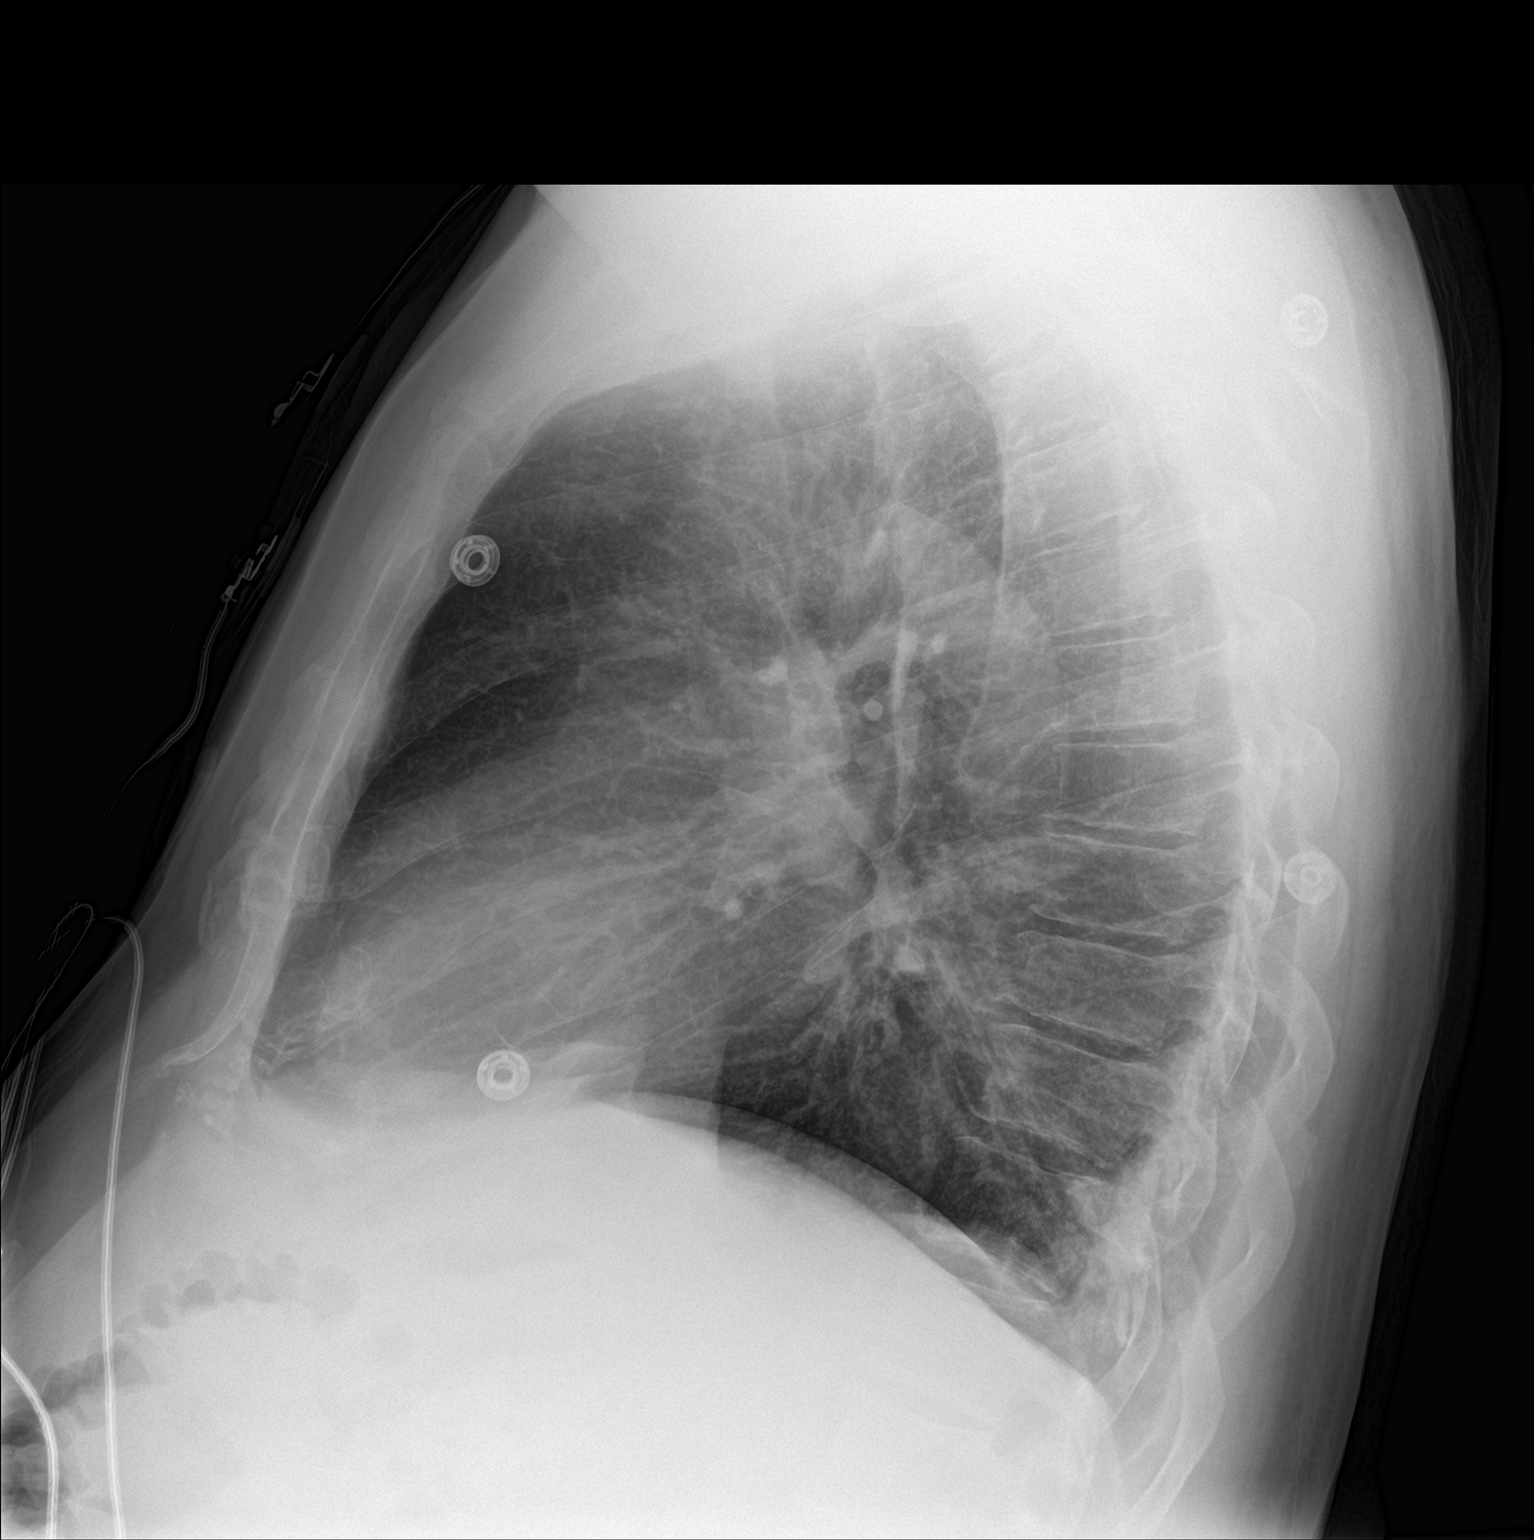

[2 of 2 positions shown; findings below may reference images not displayed]

FINDINGS: The lungs are adequately inflated and clear. The heart and pulmonary
vascularity are normal. The mediastinum is normal in width. There is
no pleural effusion or pneumothorax. The bony thorax exhibits no
acute abnormality.
IMPRESSION: There is no active cardiopulmonary disease.

## 2018-03-06 IMAGING — DX DG CHEST 2V
2 series · 2 of 2 positions shown · non-contrast
Comparison: 04/11/2016

CLINICAL DATA: Pneumonia, shortness of Breath

EXAM:
CHEST  2 VIEW

[chest pa]
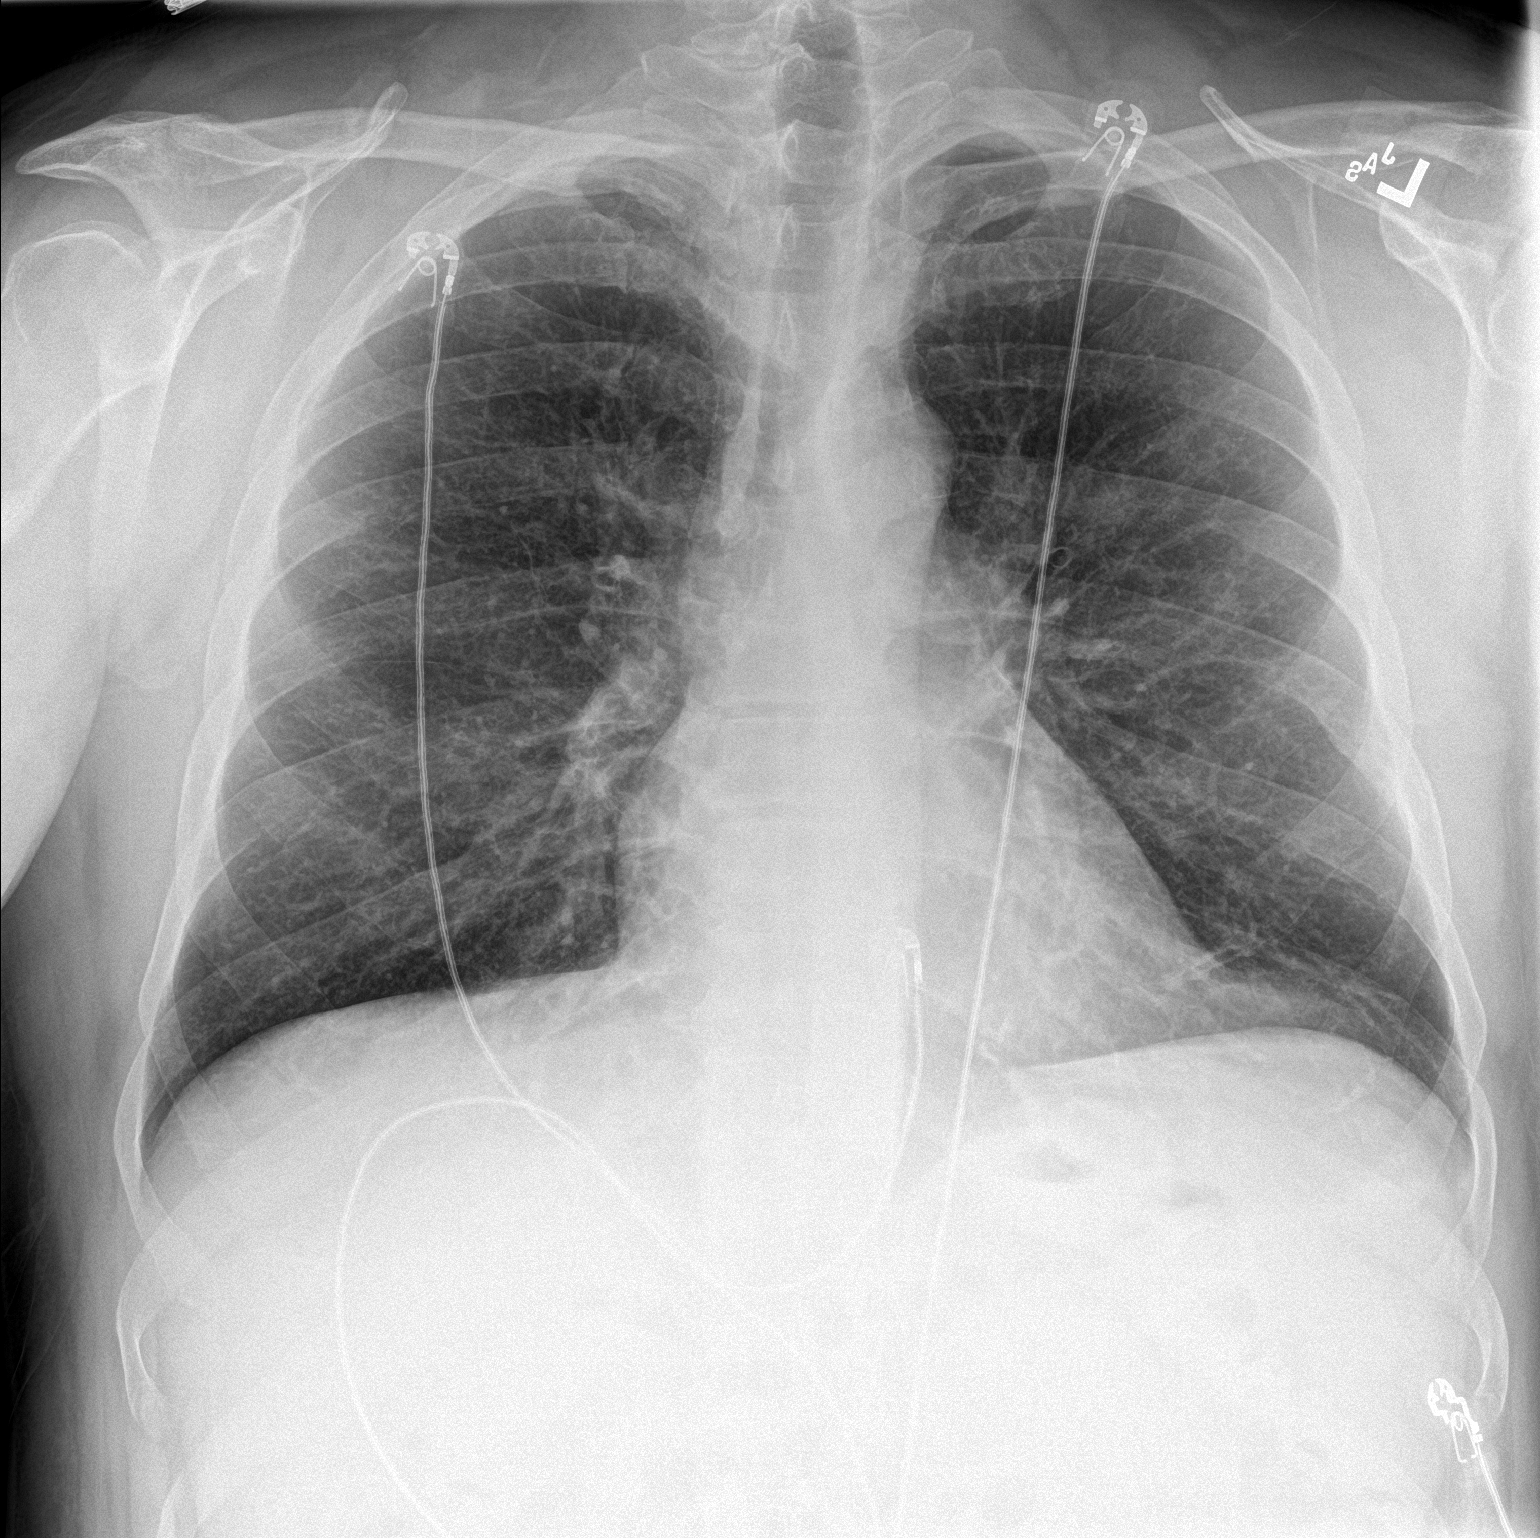

[chest lat]
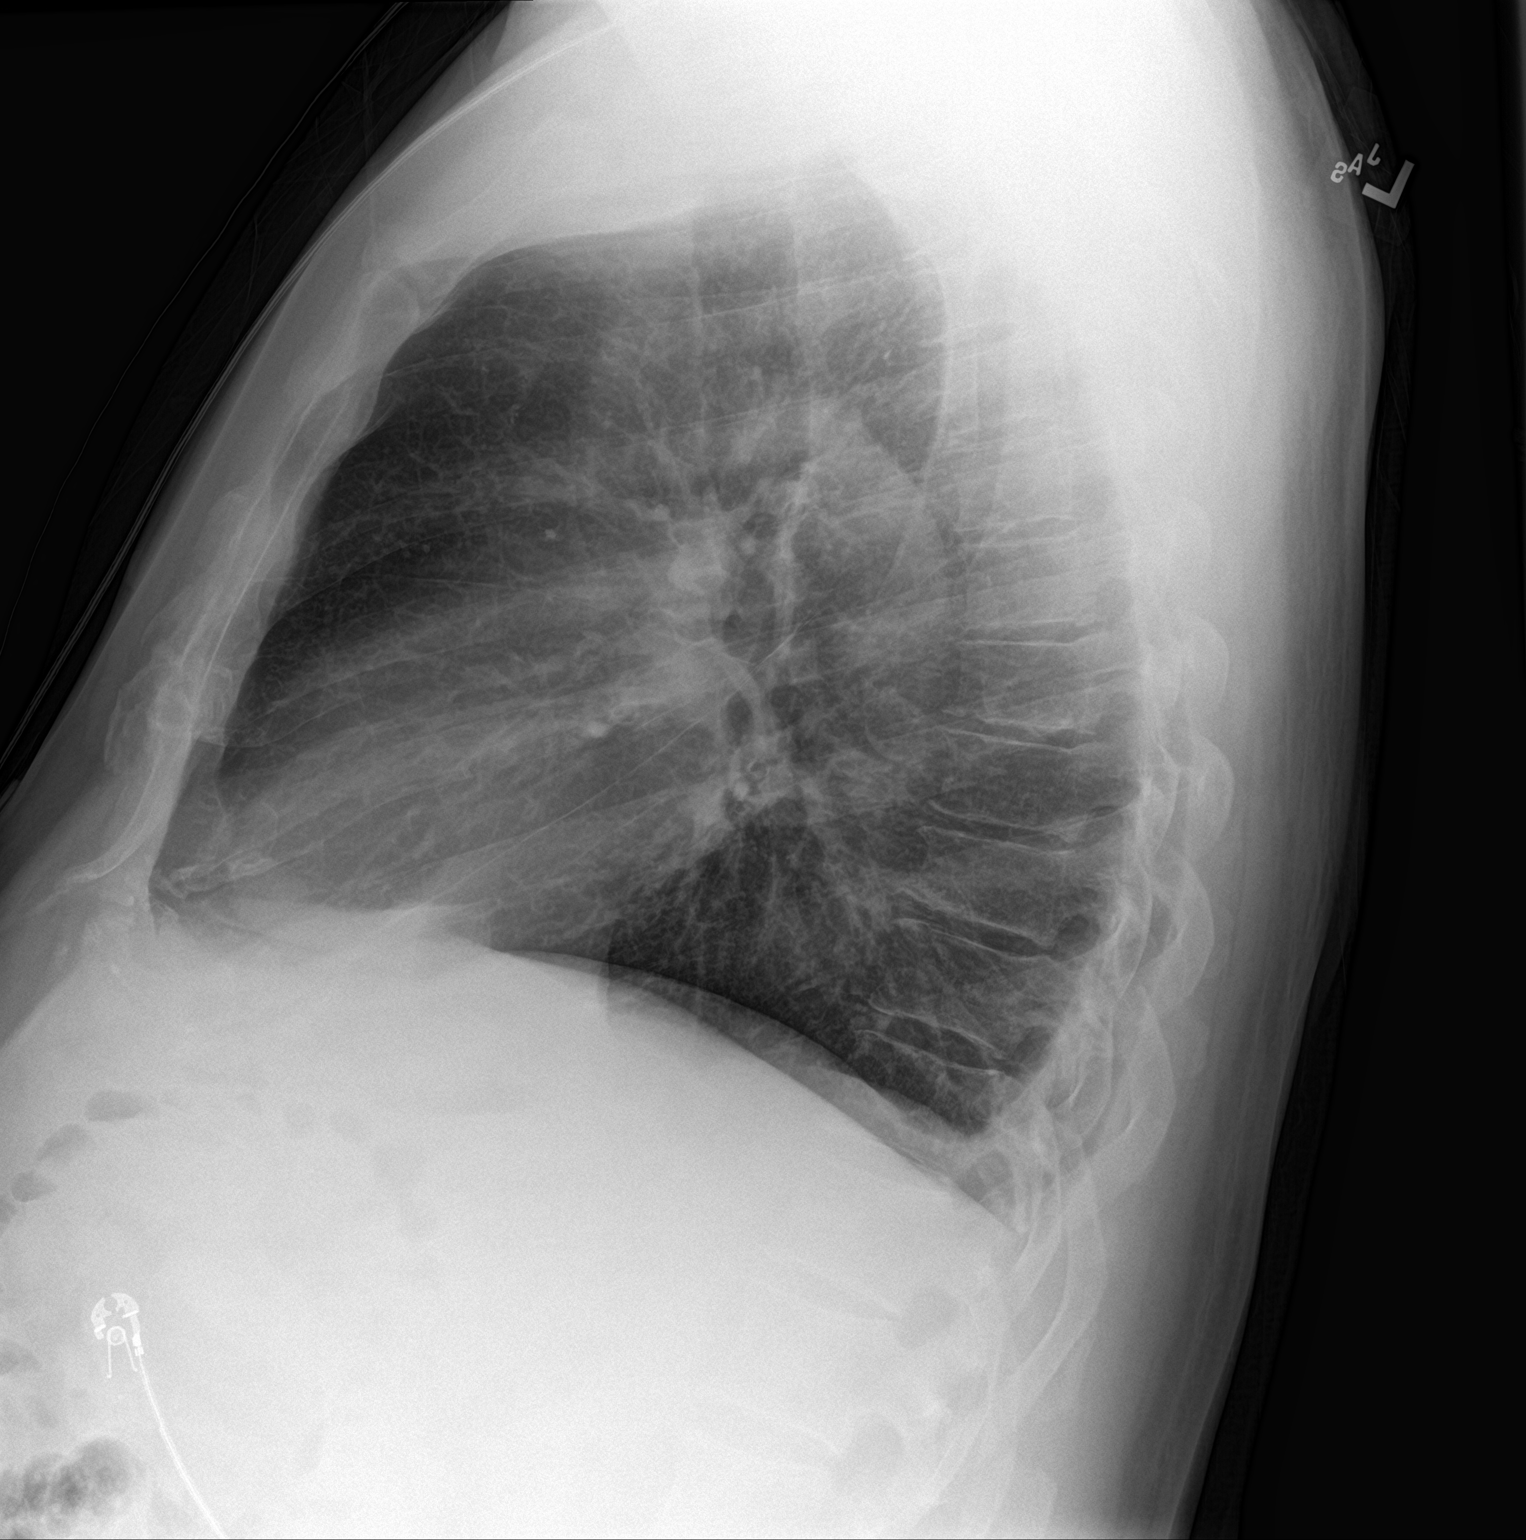

[2 of 2 positions shown; findings below may reference images not displayed]

FINDINGS: The heart size and mediastinal contours are within normal limits.
Both lungs are clear. The visualized skeletal structures are
unremarkable.
IMPRESSION: No active cardiopulmonary disease.
